# Patient Record
Sex: Female | Born: 1970
Health system: Southern US, Community
[De-identification: ages and names within clinical notes are randomized; demographics above are authoritative.]

## PROBLEM LIST (undated history)

## (undated) DIAGNOSIS — T7840XA Allergy, unspecified, initial encounter: Secondary | ICD-10-CM

## (undated) HISTORY — PX: OTHER SURGICAL HISTORY: SHX169

## (undated) HISTORY — DX: Allergy, unspecified, initial encounter: T78.40XA

---

## 2006-05-30 ENCOUNTER — Other Ambulatory Visit: Admission: RE | Admit: 2006-05-30 | Discharge: 2006-05-30 | Payer: Self-pay | Admitting: Family Medicine

## 2007-11-22 ENCOUNTER — Other Ambulatory Visit: Admission: RE | Admit: 2007-11-22 | Discharge: 2007-11-22 | Payer: Self-pay | Admitting: Family Medicine

## 2008-11-25 ENCOUNTER — Other Ambulatory Visit: Admission: RE | Admit: 2008-11-25 | Discharge: 2008-11-25 | Payer: Self-pay | Admitting: Family Medicine

## 2010-03-02 ENCOUNTER — Encounter: Admission: RE | Admit: 2010-03-02 | Discharge: 2010-03-02 | Payer: Self-pay | Admitting: Family Medicine

## 2010-03-09 ENCOUNTER — Ambulatory Visit: Payer: Self-pay | Admitting: Hematology and Oncology

## 2010-03-24 LAB — COMPREHENSIVE METABOLIC PANEL
ALT: 23 U/L (ref 0–35)
BUN: 10 mg/dL (ref 6–23)
CO2: 21 mEq/L (ref 19–32)
Calcium: 9.1 mg/dL (ref 8.4–10.5)
Chloride: 103 mEq/L (ref 96–112)
Creatinine, Ser: 0.74 mg/dL (ref 0.40–1.20)
Glucose, Bld: 94 mg/dL (ref 70–99)
Potassium: 3.9 mEq/L (ref 3.5–5.3)
Total Bilirubin: 0.6 mg/dL (ref 0.3–1.2)

## 2010-03-24 LAB — CBC WITH DIFFERENTIAL/PLATELET
Basophils Absolute: 0 10*3/uL (ref 0.0–0.1)
EOS%: 1.1 % (ref 0.0–7.0)
Eosinophils Absolute: 0.1 10*3/uL (ref 0.0–0.5)
HCT: 38 % (ref 34.8–46.6)
MCHC: 34.6 g/dL (ref 31.5–36.0)
MONO#: 0.4 10*3/uL (ref 0.1–0.9)
NEUT%: 75.9 % (ref 38.4–76.8)
WBC: 8 10*3/uL (ref 3.9–10.3)
lymph#: 1.4 10*3/uL (ref 0.9–3.3)

## 2010-03-24 LAB — LACTATE DEHYDROGENASE: LDH: 105 U/L (ref 94–250)

## 2011-01-17 ENCOUNTER — Encounter: Payer: Self-pay | Admitting: Family Medicine

## 2011-01-17 ENCOUNTER — Encounter: Payer: Self-pay | Admitting: Hematology and Oncology

## 2012-07-17 ENCOUNTER — Encounter: Payer: Self-pay | Admitting: Physician Assistant

## 2012-07-17 ENCOUNTER — Ambulatory Visit (INDEPENDENT_AMBULATORY_CARE_PROVIDER_SITE_OTHER): Payer: BC Managed Care – PPO | Admitting: Physician Assistant

## 2012-07-17 VITALS — BP 121/72 | HR 81 | Ht 65.0 in | Wt 195.0 lb

## 2012-07-17 DIAGNOSIS — N926 Irregular menstruation, unspecified: Secondary | ICD-10-CM

## 2012-07-17 DIAGNOSIS — Z13220 Encounter for screening for lipoid disorders: Secondary | ICD-10-CM

## 2012-07-17 DIAGNOSIS — Z131 Encounter for screening for diabetes mellitus: Secondary | ICD-10-CM

## 2012-07-17 DIAGNOSIS — Z0000 Encounter for general adult medical examination without abnormal findings: Secondary | ICD-10-CM

## 2012-07-17 DIAGNOSIS — Z1239 Encounter for other screening for malignant neoplasm of breast: Secondary | ICD-10-CM

## 2012-07-17 NOTE — Patient Instructions (Addendum)
Will call with mammogram. Will get labs and call with results. Continue to exercise regularly and maintain a healthy diet. Start prenatal vitamins today since wanting to become pregnant. Recommend 4 servings of dairy daily.

## 2012-07-18 ENCOUNTER — Encounter: Payer: Self-pay | Admitting: Physician Assistant

## 2012-07-18 LAB — HCG, SERUM, QUALITATIVE: Preg, Serum: NEGATIVE

## 2012-07-18 NOTE — Progress Notes (Signed)
  Subjective:     Mariah Moore is a 41 y.o. female and is here for a comprehensive physical exam. The patient reports that she has missed this months period. They are trying to get pregnant. Home pregnancy test negative. She has started exercising regularly and decreasing calories. She feels great.  History   Social History  . Marital Status: Single    Spouse Name: N/A    Number of Children: N/A  . Years of Education: N/A   Occupational History  . Not on file.   Social History Main Topics  . Smoking status: Never Smoker   . Smokeless tobacco: Not on file  . Alcohol Use: Not on file  . Drug Use: Not on file  . Sexually Active: Not on file   Other Topics Concern  . Not on file   Social History Narrative  . No narrative on file   No health maintenance topics applied.  The following portions of the patient's history were reviewed and updated as appropriate: allergies, current medications, past family history, past medical history, past social history, past surgical history and problem list.  Review of Systems A comprehensive review of systems was negative.   Objective:    BP 121/72  Pulse 81  Ht 5\' 5"  (1.651 m)  Wt 195 lb (88.451 kg)  BMI 32.45 kg/m2  SpO2 100%  LMP 05/31/2012 General appearance: alert, cooperative, appears stated age and mildly obese Head: Normocephalic, without obvious abnormality, atraumatic Eyes: conjunctivae/corneas clear. PERRL, EOM's intact. Fundi benign. Ears: normal TM's and external ear canals both ears Nose: Nares normal. Septum midline. Mucosa normal. No drainage or sinus tenderness. Throat: lips, mucosa, and tongue normal; teeth and gums normal Neck: no adenopathy, no carotid bruit, no JVD, supple, symmetrical, trachea midline and thyroid not enlarged, symmetric, no tenderness/mass/nodules Back: symmetric, no curvature. ROM normal. No CVA tenderness. Lungs: clear to auscultation bilaterally Breasts: normal appearance, no masses or  tenderness Heart: regular rate and rhythm, S1, S2 normal, no murmur, click, rub or gallop Abdomen: soft, non-tender; bowel sounds normal; no masses,  no organomegaly Extremities: extremities normal, atraumatic, no cyanosis or edema Pulses: 2+ and symmetric Skin: Skin color, texture, turgor normal. No rashes or lesions Lymph nodes: Cervical, supraclavicular, and axillary nodes normal. Neurologic: Grossly normal    Assessment:    Healthy female exam.      Plan:    CPE- Last Pap 2011. Pap in 1 more years. Vaccines up to date. Start prenatal vitamins since trying to become pregnant. Will do pregnancy test and call with results. Mild exercise to stay healthy even if pregnant is  always helpful. Will get fasting labs and call patient with results. Make sure you continue to have a balanced diet. Will refer for mammogram. Due to family hx of breast cancer.  See After Visit Summary for Counseling Recommendations

## 2012-07-20 LAB — LIPID PANEL
Total CHOL/HDL Ratio: 3.3 Ratio
Triglycerides: 126 mg/dL (ref <150)
VLDL: 25 mg/dL (ref 0–40)

## 2012-07-20 LAB — COMPREHENSIVE METABOLIC PANEL
ALT: 15 U/L (ref 0–35)
AST: 14 U/L (ref 0–37)
Albumin: 4.2 g/dL (ref 3.5–5.2)
Calcium: 9.3 mg/dL (ref 8.4–10.5)
Creat: 0.76 mg/dL (ref 0.50–1.10)
Total Bilirubin: 0.9 mg/dL (ref 0.3–1.2)
Total Protein: 6.6 g/dL (ref 6.0–8.3)

## 2012-07-27 ENCOUNTER — Ambulatory Visit (INDEPENDENT_AMBULATORY_CARE_PROVIDER_SITE_OTHER): Payer: BC Managed Care – PPO | Admitting: Family Medicine

## 2012-07-27 DIAGNOSIS — R739 Hyperglycemia, unspecified: Secondary | ICD-10-CM

## 2012-07-27 DIAGNOSIS — R7309 Other abnormal glucose: Secondary | ICD-10-CM

## 2012-07-27 NOTE — Progress Notes (Signed)
  Subjective:    Patient ID: Mariah Moore, female    DOB: 08-Jan-1971, 41 y.o.   MRN: [id number]  HPI A1c check. Sugar elevated on last labs and was instructed to come by for A1c check.   Review of Systems     Objective:   Physical Exam        Assessment & Plan:  Elevated glucose - A1C is normal. No diabetes.  Pt informed.  Nani Gasser, MD

## 2012-08-01 ENCOUNTER — Ambulatory Visit (INDEPENDENT_AMBULATORY_CARE_PROVIDER_SITE_OTHER): Payer: BC Managed Care – PPO

## 2012-08-01 DIAGNOSIS — Z1231 Encounter for screening mammogram for malignant neoplasm of breast: Secondary | ICD-10-CM

## 2012-08-01 DIAGNOSIS — Z1239 Encounter for other screening for malignant neoplasm of breast: Secondary | ICD-10-CM

## 2012-10-19 ENCOUNTER — Encounter: Payer: Self-pay | Admitting: Family Medicine

## 2012-10-19 ENCOUNTER — Ambulatory Visit (INDEPENDENT_AMBULATORY_CARE_PROVIDER_SITE_OTHER): Payer: BC Managed Care – PPO | Admitting: Family Medicine

## 2012-10-19 VITALS — BP 133/85 | HR 100 | Temp 98.3°F | Ht 65.0 in | Wt 197.0 lb

## 2012-10-19 DIAGNOSIS — J329 Chronic sinusitis, unspecified: Secondary | ICD-10-CM

## 2012-10-19 DIAGNOSIS — J029 Acute pharyngitis, unspecified: Secondary | ICD-10-CM

## 2012-10-19 DIAGNOSIS — H6590 Unspecified nonsuppurative otitis media, unspecified ear: Secondary | ICD-10-CM

## 2012-10-19 MED ORDER — FEXOFENADINE-PSEUDOEPHED ER 180-240 MG PO TB24: 1.0000 | ORAL_TABLET | Freq: Every day | ORAL | Status: DC

## 2012-10-19 MED ORDER — AMOXICILLIN-POT CLAVULANATE 875-125 MG PO TABS: 1.0000 | ORAL_TABLET | Freq: Two times a day (BID) | ORAL | Status: DC

## 2012-10-19 MED ORDER — FLUTICASONE PROPIONATE 50 MCG/ACT NA SUSP: 2.0000 | Freq: Every day | NASAL | Status: DC

## 2012-10-19 NOTE — Patient Instructions (Signed)

## 2012-10-19 NOTE — Progress Notes (Signed)
  Subjective:    Patient ID: Mariah Moore, female    DOB: Feb 24, 1971, 41 y.o.   MRN: [id number]  Otalgia  There is pain in both (L>R) ears. This is a new problem. The current episode started in the past 7 days. The problem occurs constantly. The problem has been gradually worsening. The maximum temperature recorded prior to her arrival was 100 - 100.9 F. The fever has been present for 1 to 2 days. The pain is at a severity of 8/10. The pain is moderate. Associated symptoms include coughing, drainage, headaches, hearing loss, rhinorrhea and a sore throat. Pertinent negatives include no abdominal pain, diarrhea, ear discharge, neck pain, rash or vomiting. She has tried acetaminophen (OTC) for the symptoms. The treatment provided no relief. There is no history of a chronic ear infection, hearing loss or a tympanostomy tube.  Sore Throat  This is a new problem. The current episode started in the past 7 days. The problem has been gradually worsening. The pain is worse on the left (center of throat) side. The maximum temperature recorded prior to her arrival was 100 - 100.9 F. The fever has been present for 1 to 2 days. The pain is at a severity of 8/10. Associated symptoms include coughing, ear pain and headaches. Pertinent negatives include no abdominal pain, diarrhea, ear discharge, neck pain or vomiting. Treatments tried: OTC. The treatment provided no relief.      Review of Systems  HENT: Positive for hearing loss, ear pain, sore throat and rhinorrhea. Negative for neck pain and ear discharge.   Respiratory: Positive for cough.   Gastrointestinal: Negative for vomiting, abdominal pain and diarrhea.  Skin: Negative for rash.  Neurological: Positive for headaches.      BP 133/85  Pulse 100  Temp 98.3 F (36.8 C) (Oral)  Ht 5\' 5"  (1.651 m)  Wt 197 lb (89.359 kg)  BMI 32.78 kg/m2  SpO2 100%  LMP 10/06/2012 Objective:   Physical Exam  Nursing note and vitals reviewed. Constitutional: She is  oriented to person, place, and time. She appears well-developed and well-nourished. No distress.  HENT:  Head: Normocephalic and atraumatic.  Right Ear: Tympanic membrane normal.  Left Ear: Tympanic membrane normal.  Nose: Mucosal edema and rhinorrhea present. Left sinus exhibits maxillary sinus tenderness.  Mouth/Throat: No oral lesions. Posterior oropharyngeal erythema present.          + Swelling LEFT external ear canal. Fluid behind both ears w/L ear>R   Eyes: Conjunctivae normal are normal.  Neck: Neck supple. No thyromegaly present.  Lymphadenopathy:    She has cervical adenopathy.  Neurological: She is alert and oriented to person, place, and time.  Skin: Skin is warm and dry.      Results for orders placed in visit on 10/19/12  POCT RAPID STREP A (OFFICE)      Component Value Range   Rapid Strep A Screen Negative  Negative   Assessment & Plan:   Sinusitis,L serous otitis, pharyngitis. Augmentin, Allegra-D, flonase nasal spray

## 2013-02-20 ENCOUNTER — Encounter: Payer: BC Managed Care – PPO | Admitting: Obstetrics & Gynecology

## 2013-09-03 ENCOUNTER — Other Ambulatory Visit: Payer: Self-pay | Admitting: Physician Assistant

## 2013-09-03 DIAGNOSIS — Z1231 Encounter for screening mammogram for malignant neoplasm of breast: Secondary | ICD-10-CM

## 2013-09-11 ENCOUNTER — Ambulatory Visit (INDEPENDENT_AMBULATORY_CARE_PROVIDER_SITE_OTHER): Payer: BC Managed Care – PPO

## 2013-09-11 DIAGNOSIS — Z1231 Encounter for screening mammogram for malignant neoplasm of breast: Secondary | ICD-10-CM

## 2013-09-26 ENCOUNTER — Encounter: Payer: Self-pay | Admitting: Physician Assistant

## 2013-09-26 ENCOUNTER — Ambulatory Visit (INDEPENDENT_AMBULATORY_CARE_PROVIDER_SITE_OTHER): Payer: BC Managed Care – PPO | Admitting: Physician Assistant

## 2013-09-26 VITALS — BP 118/79 | HR 85 | Wt 199.0 lb

## 2013-09-26 DIAGNOSIS — R51 Headache: Secondary | ICD-10-CM

## 2013-09-26 DIAGNOSIS — R030 Elevated blood-pressure reading, without diagnosis of hypertension: Secondary | ICD-10-CM

## 2013-09-26 DIAGNOSIS — Z23 Encounter for immunization: Secondary | ICD-10-CM

## 2013-09-26 DIAGNOSIS — J019 Acute sinusitis, unspecified: Secondary | ICD-10-CM

## 2013-09-26 MED ORDER — AMOXICILLIN-POT CLAVULANATE 875-125 MG PO TABS: 1.0000 | ORAL_TABLET | Freq: Two times a day (BID) | ORAL | Status: DC

## 2013-09-26 NOTE — Patient Instructions (Signed)

## 2013-09-26 NOTE — Progress Notes (Signed)
  Subjective:    Patient ID: Mariah Moore, female    DOB: 1971-09-27, 42 y.o.   MRN: [id number]  HPI Patient is a 42 year old female who presents to the clinic wanting to followup own blood pressure readings.  Patient has been taking blood pressure at home and readings have more frequently been in the 140 over upper 80s range. She has also complained of headache for 3 weeks every day and she wonders if headache and blood pressure to be correlated. She denies any chest pains, palpitations or vision changes. Her head aches in the frontal bilateral region of head. She describes the pain as dull. She also complains of some facial pain more on the right than the left. She takes Flonase and Zyrtec daily. She has been taking Tylenol for headache and does help some. She feels like her stress level is also a trigger for headaches. She denies any fever, chills, nausea, vomiting or sore throat. She does report some ear congestion bilaterally.    Review of Systems      Objective:   Physical Exam  Constitutional: She is oriented to person, place, and time. She appears well-developed and well-nourished.  HENT:  Head: Normocephalic and atraumatic.  Right Ear: External ear normal.  Left Ear: External ear normal.  TMs are strained with fullness behind membrane obstructing view of the ossicles.  Bilateral maxillary tenderness to palpation.  Bilateral turbinates red and swollen.  Oropharynx erythematous with postnasal drip present.  Eyes: Conjunctivae are normal.  Neck: Normal range of motion. Neck supple.  Cardiovascular: Normal rate, regular rhythm and normal heart sounds.   Pulmonary/Chest: Effort normal and breath sounds normal. She has no wheezes.  Lymphadenopathy:    She has no cervical adenopathy.  Neurological: She is alert and oriented to person, place, and time.  Skin: Skin is warm and dry.  Psychiatric: She has a normal mood and affect. Her behavior is normal.          Assessment &  Plan:  Elevated blood pressure reading-reassured patient after today's blood pressure reading that I think she is taking her blood pressures either at the wrong time or there might be something wrong with the cuff. I encouraged patient to keep a good watch out home and she is more than welcome to come have [place] check her blood pressure is concerned.  Sinusitis/headache-I believe the headache could be coming from sinusitis. Patient did have a lot of fluid behind her ears as well as facial pain. Treated with Augmentin for 10 days. Continue using nasal spray and Zyrtec. Continue to take ibuprofen or Tylenol for headache. Call if not improving or slowly worsening.   Flu shot given today without complication.

## 2014-02-22 ENCOUNTER — Encounter: Payer: Self-pay | Admitting: Physician Assistant

## 2014-02-22 ENCOUNTER — Ambulatory Visit (INDEPENDENT_AMBULATORY_CARE_PROVIDER_SITE_OTHER): Payer: BC Managed Care – PPO | Admitting: Physician Assistant

## 2014-02-22 VITALS — BP 137/79 | HR 106 | Wt 206.0 lb

## 2014-02-22 DIAGNOSIS — R300 Dysuria: Secondary | ICD-10-CM

## 2014-02-22 DIAGNOSIS — O26899 Other specified pregnancy related conditions, unspecified trimester: Secondary | ICD-10-CM

## 2014-02-22 DIAGNOSIS — R319 Hematuria, unspecified: Secondary | ICD-10-CM

## 2014-02-22 DIAGNOSIS — N898 Other specified noninflammatory disorders of vagina: Secondary | ICD-10-CM

## 2014-02-22 LAB — POCT URINALYSIS DIPSTICK
Bilirubin, UA: NEGATIVE
GLUCOSE UA: NEGATIVE
Ketones, UA: NEGATIVE
Leukocytes, UA: NEGATIVE
Nitrite, UA: NEGATIVE
Protein, UA: NEGATIVE
Spec Grav, UA: <=1.005
UROBILINOGEN UA: 0.2
pH, UA: 6

## 2014-02-22 LAB — WET PREP FOR TRICH, YEAST, CLUE
CLUE CELLS WET PREP: NONE SEEN
Trich, Wet Prep: NONE SEEN
WBC WET PREP: NONE SEEN
YEAST WET PREP: NONE SEEN

## 2014-02-22 NOTE — Progress Notes (Signed)
   Subjective:    Patient ID: Mariah Moore, female    DOB: 05/31/1971, 43 y.o.   MRN: [id number]  HPI Pt is a 43 yo female who presents to the clinic with dysuria for one week. She denies any fever, chills, abdominal pain, nausea, vomiting, increase in frequency/urgency of urine. She has noticed a brownish discharge. She is 7 and 1/[redacted] weeks pregnant. She has some itching but off and on. Dysuria is not with every urination. She has not tried anything to make better and nothing seems to make worse.    Review of Systems     Objective:   Physical Exam  Constitutional: She is oriented to person, place, and time. She appears well-developed and well-nourished.  HENT:  Head: Normocephalic and atraumatic.  Cardiovascular: Regular rhythm and normal heart sounds.   Tachycardia at 106.   Pulmonary/Chest: Effort normal and breath sounds normal.  No CVA tenderness.  Abdominal: Soft. Bowel sounds are normal. She exhibits no distension and no mass. There is no tenderness. There is no rebound and no guarding.  Neurological: She is alert and oriented to person, place, and time.  Skin: Skin is dry.  Psychiatric: She has a normal mood and affect. Her behavior is normal.          Assessment & Plan:  Dysuria- UA positive for trace blood, negative for leuks, nitrates, protein. Will culture. Gave HO on dysuria. Since pregnant and symptoms mild I do not want to treat until confirm with culture. Call if symptoms worsening. Stay hydrated.   Vaginal discharge- will get stat wet prep. Could be increase in vaginal secretions since pregnant. Will continue to monitor.

## 2014-02-22 NOTE — Patient Instructions (Signed)

## 2014-02-23 ENCOUNTER — Encounter: Payer: Self-pay | Admitting: Physician Assistant

## 2014-02-24 ENCOUNTER — Encounter: Payer: Self-pay | Admitting: Physician Assistant

## 2014-02-24 LAB — URINE CULTURE: Colony Count: 50000

## 2014-03-08 ENCOUNTER — Ambulatory Visit: Payer: BC Managed Care – PPO | Admitting: Physician Assistant

## 2014-05-29 ENCOUNTER — Other Ambulatory Visit (HOSPITAL_COMMUNITY)
Admission: RE | Admit: 2014-05-29 | Discharge: 2014-05-29 | Disposition: A | Discharge status: Home / Self Care | Payer: BC Managed Care – PPO | Source: Ambulatory Visit | Attending: Family Medicine | Admitting: Family Medicine

## 2014-05-29 ENCOUNTER — Ambulatory Visit (INDEPENDENT_AMBULATORY_CARE_PROVIDER_SITE_OTHER): Payer: BC Managed Care – PPO | Admitting: Physician Assistant

## 2014-05-29 ENCOUNTER — Encounter: Payer: Self-pay | Admitting: Physician Assistant

## 2014-05-29 VITALS — BP 117/66 | HR 85 | Ht 65.0 in | Wt 196.0 lb

## 2014-05-29 DIAGNOSIS — Z01419 Encounter for gynecological examination (general) (routine) without abnormal findings: Secondary | ICD-10-CM | POA: Insufficient documentation

## 2014-05-29 DIAGNOSIS — Z0000 Encounter for general adult medical examination without abnormal findings: Secondary | ICD-10-CM

## 2014-05-29 DIAGNOSIS — Z8759 Personal history of other complications of pregnancy, childbirth and the puerperium: Secondary | ICD-10-CM | POA: Insufficient documentation

## 2014-05-29 DIAGNOSIS — Z1151 Encounter for screening for human papillomavirus (HPV): Secondary | ICD-10-CM | POA: Insufficient documentation

## 2014-05-29 DIAGNOSIS — Z6832 Body mass index (BMI) 32.0-32.9, adult: Secondary | ICD-10-CM | POA: Insufficient documentation

## 2014-05-29 DIAGNOSIS — E669 Obesity, unspecified: Secondary | ICD-10-CM | POA: Insufficient documentation

## 2014-05-29 DIAGNOSIS — Z131 Encounter for screening for diabetes mellitus: Secondary | ICD-10-CM

## 2014-05-29 LAB — COMPLETE METABOLIC PANEL WITH GFR
ALBUMIN: 4.3 g/dL (ref 3.5–5.2)
ALK PHOS: 76 U/L (ref 39–117)
ALT: 19 U/L (ref 0–35)
AST: 17 U/L (ref 0–37)
BILIRUBIN TOTAL: 0.9 mg/dL (ref 0.2–1.2)
BUN: 11 mg/dL (ref 6–23)
CO2: 26 meq/L (ref 19–32)
Calcium: 9.1 mg/dL (ref 8.4–10.5)
Chloride: 101 mEq/L (ref 96–112)
Creat: 0.66 mg/dL (ref 0.50–1.10)
GFR, Est Non African American: >89 mL/min
Glucose, Bld: 105 mg/dL — ABNORMAL HIGH (ref 70–99)
Potassium: 4.1 mEq/L (ref 3.5–5.3)
Sodium: 137 mEq/L (ref 135–145)
Total Protein: 6.7 g/dL (ref 6.0–8.3)

## 2014-05-29 LAB — TSH: TSH: 1.704 u[IU]/mL (ref 0.350–4.500)

## 2014-05-29 NOTE — Progress Notes (Signed)
  Subjective:     Mariah Moore is a 43 y.o. female and is here for a comprehensive physical exam. The patient reports no problems.  Patient does report that she had a miscarriage at 8 weeks after last visit. She is going to start trying to have another baby this month. She is working with [facility] for prenatal care.  History   Social History  . Marital Status: Married    Spouse Name: N/A    Number of Children: N/A  . Years of Education: N/A   Occupational History  . Not on file.   Social History Main Topics  . Smoking status: Never Smoker   . Smokeless tobacco: Not on file  . Alcohol Use: Not on file  . Drug Use: Not on file  . Sexual Activity: Not on file   Other Topics Concern  . Not on file   Social History Narrative  . No narrative on file   Health Maintenance  Topic Date Due  . Influenza Vaccine  07/27/2014  . Mammogram  09/11/2014  . Pap Smear  05/29/2017  . Tetanus/tdap  10/27/2020    The following portions of the patient's history were reviewed and updated as appropriate: allergies, current medications, past family history, past medical history, past social history, past surgical history and problem list.  Review of Systems A comprehensive review of systems was negative.   Objective:    BP 117/66  Pulse 85  Ht 5\' 5"  (1.651 m)  Wt 196 lb (88.905 kg)  BMI 32.62 kg/m2  LMP 05/15/2014  Breastfeeding? Unknown General appearance: alert, cooperative, appears stated age, mildly obese and moderately obese Head: Normocephalic, without obvious abnormality, atraumatic Eyes: conjunctivae/corneas clear. PERRL, EOM's intact. Fundi benign. Ears: normal TM's and external ear canals both ears Nose: Nares normal. Septum midline. Mucosa normal. No drainage or sinus tenderness. Throat: lips, mucosa, and tongue normal; teeth and gums normal Neck: no adenopathy, no carotid bruit, no JVD, supple, symmetrical, trachea midline and thyroid not enlarged, symmetric, no  tenderness/mass/nodules Back: symmetric, no curvature. ROM normal. No CVA tenderness. Lungs: clear to auscultation bilaterally Heart: regular rate and rhythm, S1, S2 normal, no murmur, click, rub or gallop Abdomen: soft, non-tender; bowel sounds normal; no masses,  no organomegaly Pelvic: cervix normal in appearance, external genitalia normal, no adnexal masses or tenderness, no cervical motion tenderness, uterus normal size, shape, and consistency and vagina normal without discharge Extremities: extremities normal, atraumatic, no cyanosis or edema Pulses: 2+ and symmetric Skin: Skin color, texture, turgor normal. No rashes or lesions Lymph nodes: Cervical, supraclavicular, and axillary nodes normal. Neurologic: Grossly normal    Assessment:    Healthy female exam.      Plan:     CPE-Pap done today will call with results. Patient's cholesterol was placed in and 2013. We'll wait at least another year to recheck. Order fasting CMP and thyroid today. Discussed continued to work on weight loss with exercise 150 minutes weekly as well as maintaining a healthy balanced diet. Urged patient to stay on prenatal vitamins since wanting to become pregnant. Patient's last mammogram was done in 9/2 is a 16 due to family history of cancer. Will wait until we see her to repeat that she's pregnant will wait after pregnancy.  See After Visit Summary for Counseling Recommendations

## 2014-05-29 NOTE — Patient Instructions (Signed)

## 2014-05-30 LAB — CYTOLOGY - PAP

## 2014-05-31 ENCOUNTER — Encounter: Payer: Self-pay | Admitting: Physician Assistant

## 2014-06-04 ENCOUNTER — Encounter: Payer: Self-pay | Admitting: Family Medicine

## 2014-06-04 ENCOUNTER — Ambulatory Visit (INDEPENDENT_AMBULATORY_CARE_PROVIDER_SITE_OTHER): Payer: BC Managed Care – PPO | Admitting: Family Medicine

## 2014-06-04 ENCOUNTER — Ambulatory Visit (INDEPENDENT_AMBULATORY_CARE_PROVIDER_SITE_OTHER): Payer: BC Managed Care – PPO

## 2014-06-04 ENCOUNTER — Telehealth: Payer: Self-pay | Admitting: *Deleted

## 2014-06-04 VITALS — BP 127/78 | HR 88 | Temp 98.1°F | Wt 196.0 lb

## 2014-06-04 DIAGNOSIS — R1031 Right lower quadrant pain: Secondary | ICD-10-CM

## 2014-06-04 DIAGNOSIS — R109 Unspecified abdominal pain: Secondary | ICD-10-CM

## 2014-06-04 DIAGNOSIS — E7889 Other lipoprotein metabolism disorders: Secondary | ICD-10-CM

## 2014-06-04 LAB — POCT URINE PREGNANCY: Preg Test, Ur: NEGATIVE

## 2014-06-04 LAB — POCT URINALYSIS DIPSTICK
Bilirubin, UA: NEGATIVE
Glucose, UA: NEGATIVE
KETONES UA: NEGATIVE
LEUKOCYTES UA: NEGATIVE
Nitrite, UA: NEGATIVE
PH UA: 6
RBC UA: NEGATIVE
Urobilinogen, UA: 0.2

## 2014-06-04 MED ORDER — IOHEXOL 300 MG/ML  SOLN
100.0000 mL | Freq: Once | INTRAMUSCULAR | Status: AC | PRN
  Administered 2014-06-04: 100 mL via INTRAVENOUS

## 2014-06-04 MED ORDER — DICYCLOMINE HCL 10 MG PO CAPS: 10.0000 mg | ORAL_CAPSULE | Freq: Four times a day (QID) | ORAL | Status: DC | PRN

## 2014-06-04 NOTE — Telephone Encounter (Signed)
PA obtained for MRI ABD/Pelvis w/contrast. Auth # [id number]. Exp. 07/03/14.  Meyer Cory, LPN

## 2014-06-04 NOTE — Progress Notes (Signed)
CC: Mariah Moore is a 43 y.o. female is here for RUQ pain   Subjective: HPI:   Right lower quadrant pain described as stabbing sensation that was 10 out of 10 yesterday occurring for minutes every 5 minutes. Nothing particularly makes better, it is worse with pressing on the abdomen in the right lower quadrant. It is nonradiating.  Interventions included taking leftover pain medication, she is uncertain exactly what it was. She woke this morning and pain is still lingering but mild in severity and worse with touching the abdomen. She reports chills and sweats this morning and questionable fever yesterday. She denies nausea, vomiting, diarrhea, constipation, vaginal discharge, dysuria, flank pain, nor decreased appetite. She has a history of fibroid surgery but no other abdominal or pelvic surgeries.  Review Of Systems Outlined In HPI  Past Medical History  Diagnosis Date  . Allergy     Past Surgical History  Procedure Laterality Date  . Cesarean section    . Fibriod removal     Family History  Problem Relation Age of Onset  . Cancer Mother     Breast  . Hypertension Mother   . Hyperlipidemia Mother   . Cancer Paternal Grandmother     Breast  . Diabetes Father   . Cancer Father     non hodkins lymphoma  . Stroke Maternal Aunt   . Hypertension Maternal Aunt   . Heart attack Maternal Aunt     History   Social History  . Marital Status: Married    Spouse Name: N/A    Number of Children: N/A  . Years of Education: N/A   Occupational History  . Not on file.   Social History Main Topics  . Smoking status: Never Smoker   . Smokeless tobacco: Not on file  . Alcohol Use: Not on file  . Drug Use: Not on file  . Sexual Activity: Not on file   Other Topics Concern  . Not on file   Social History Narrative  . No narrative on file     Objective: BP 127/78  Pulse 88  Temp(Src) 98.1 F (36.7 C) (Oral)  Wt 196 lb (88.905 kg)  LMP 05/15/2014  General: Alert and  Oriented, No Acute Distress HEENT: Pupils equal, round, reactive to light. Conjunctivae clear.  Moist mucous members are unremarkable Lungs: Clear to auscultation bilaterally, no wheezing/ronchi/rales.  Comfortable work of breathing. Good air movement. Cardiac: Regular rate and rhythm. Normal S1/S2.  No murmurs, rubs, nor gallops.   Abdomen:  Obese, hypoactive bowel sounds,  No palpable masses, she has rebound tenderness in the right lower quadrant and suprapubic region. Psoas sign positive on the right. Extremities: No peripheral edema.  Strong peripheral pulses.  Mental Status: No depression, anxiety, nor agitation. Skin: Warm and dry.  Assessment & Plan: Mariah Moore was seen today for ruq pain.  Diagnoses and associated orders for this visit:  RLQ abdominal pain - Urinalysis Dipstick - CT Abdomen Pelvis W Contrast; Future - POCT urine pregnancy - dicyclomine (BENTYL) 10 MG capsule; Take 1-2 capsules (10-20 mg total) by mouth 4 (four) times daily as needed (abdominal pain.).  Abdominal pain, unspecified site - Urinalysis Dipstick - CT Abdomen Pelvis W Contrast; Future     history and exam is concerning for acute appendicitis versus diverticulitis therefore obtain CT scan, urinalysis unremarkable and urine pregnancy test is negative.  Fortunately her CT scan was negative for any acute abdominal or pelvic process, we'll treat large bowel spasm with as needed dicyclomine.Signs  and symptoms requring emergent/urgent reevaluation were discussed with the patient.  Return if symptoms worsen or fail to improve.

## 2014-07-09 ENCOUNTER — Encounter: Payer: Self-pay | Admitting: Physician Assistant

## 2014-08-30 ENCOUNTER — Ambulatory Visit (INDEPENDENT_AMBULATORY_CARE_PROVIDER_SITE_OTHER): Payer: BC Managed Care – PPO | Admitting: Family Medicine

## 2014-08-30 ENCOUNTER — Encounter: Payer: Self-pay | Admitting: Family Medicine

## 2014-08-30 VITALS — BP 119/78 | HR 83 | Temp 98.0°F | Wt 190.0 lb

## 2014-08-30 DIAGNOSIS — J0100 Acute maxillary sinusitis, unspecified: Secondary | ICD-10-CM | POA: Diagnosis not present

## 2014-08-30 MED ORDER — AMOXICILLIN-POT CLAVULANATE 875-125 MG PO TABS: 1.0000 | ORAL_TABLET | Freq: Two times a day (BID) | ORAL | Status: DC

## 2014-08-30 NOTE — Progress Notes (Signed)
   Subjective:    Patient ID: Mariah Moore, female    DOB: 1971-05-05, 43 y.o.   MRN: [id number]  Sinusitis   Sinus sxs x 5 days she has been using sudafed, and tylenol. she has been experiencing headaches and pressure sore around her nose and yellow mucus. She feels like she is getting worse.  Has had some low grade temp. No ST.  Sore on her cheekc and bilat ear aching.  No recent antibiotic.     Review of Systems     Objective:   Physical Exam  Constitutional: She is oriented to person, place, and time. She appears well-developed and well-nourished.  HENT:  Head: Normocephalic and atraumatic.  Right Ear: External ear normal.  Left Ear: External ear normal.  Nose: Nose normal.  Mouth/Throat: Oropharynx is clear and moist.  TMs and canals are clear.   Eyes: Conjunctivae and EOM are normal. Pupils are equal, round, and reactive to light.  Neck: Neck supple. No thyromegaly present.  Cardiovascular: Normal rate, regular rhythm and normal heart sounds.   Pulmonary/Chest: Effort normal and breath sounds normal. She has no wheezes.  Lymphadenopathy:    She has no cervical adenopathy.  Neurological: She is alert and oriented to person, place, and time.  Skin: Skin is warm and dry.  Psychiatric: She has a normal mood and affect.          Assessment & Plan:  Acute sinusitis -  She's a little bit early for treatment but she is leaving town over the weekend. And we will be closed on Monday. I will go ahead and give her prescription. Encouraged her to get a couple more days to see if she starts to improve on her own. If not she can go ahead and fill the prescription. If she's been taking it for a week and not improving then please call the office back. Or she feels she suddenly getting worse. Continue symptomatic care as needed.

## 2014-10-15 ENCOUNTER — Other Ambulatory Visit: Payer: Self-pay | Admitting: Physician Assistant

## 2014-10-15 DIAGNOSIS — Z1231 Encounter for screening mammogram for malignant neoplasm of breast: Secondary | ICD-10-CM

## 2014-10-16 ENCOUNTER — Ambulatory Visit (INDEPENDENT_AMBULATORY_CARE_PROVIDER_SITE_OTHER): Payer: BC Managed Care – PPO

## 2014-10-16 DIAGNOSIS — Z1231 Encounter for screening mammogram for malignant neoplasm of breast: Secondary | ICD-10-CM

## 2014-10-28 ENCOUNTER — Encounter: Payer: Self-pay | Admitting: Family Medicine

## 2015-03-10 ENCOUNTER — Ambulatory Visit (INDEPENDENT_AMBULATORY_CARE_PROVIDER_SITE_OTHER): Payer: BLUE CROSS/BLUE SHIELD | Admitting: Physician Assistant

## 2015-03-10 ENCOUNTER — Encounter: Payer: Self-pay | Admitting: Physician Assistant

## 2015-03-10 VITALS — BP 129/86 | HR 95 | Temp 98.5°F | Wt 204.0 lb

## 2015-03-10 DIAGNOSIS — J0100 Acute maxillary sinusitis, unspecified: Secondary | ICD-10-CM | POA: Diagnosis not present

## 2015-03-10 MED ORDER — FLUTICASONE PROPIONATE 50 MCG/ACT NA SUSP: 2.0000 | Freq: Every day | NASAL | Status: DC

## 2015-03-10 MED ORDER — AMOXICILLIN-POT CLAVULANATE 875-125 MG PO TABS: 1.0000 | ORAL_TABLET | Freq: Two times a day (BID) | ORAL | Status: DC

## 2015-03-10 NOTE — Patient Instructions (Signed)
Mariah Moore med sinus rinse.   Sinusitis Sinusitis is redness, soreness, and inflammation of the paranasal sinuses. Paranasal sinuses are air pockets within the bones of your face (beneath the eyes, the middle of the forehead, or above the eyes). In healthy paranasal sinuses, mucus is able to drain out, and air is able to circulate through them by way of your nose. However, when your paranasal sinuses are inflamed, mucus and air can become trapped. This can allow bacteria and other germs to grow and cause infection. Sinusitis can develop quickly and last only a short time (acute) or continue over a long period (chronic). Sinusitis that lasts for more than 12 weeks is considered chronic.  CAUSES  Causes of sinusitis include:  Allergies.  Structural abnormalities, such as displacement of the cartilage that separates your nostrils (deviated septum), which can decrease the air flow through your nose and sinuses and affect sinus drainage.  Functional abnormalities, such as when the small hairs (cilia) that line your sinuses and help remove mucus do not work properly or are not present. SIGNS AND SYMPTOMS  Symptoms of acute and chronic sinusitis are the same. The primary symptoms are pain and pressure around the affected sinuses. Other symptoms include:  Upper toothache.  Earache.  Headache.  Bad breath.  Decreased sense of smell and taste.  A cough, which worsens when you are lying flat.  Fatigue.  Fever.  Thick drainage from your nose, which often is green and may contain pus (purulent).  Swelling and warmth over the affected sinuses. DIAGNOSIS  Your health care provider will perform a physical exam. During the exam, your health care provider may:  Look in your nose for signs of abnormal growths in your nostrils (nasal polyps).  Tap over the affected sinus to check for signs of infection.  View the inside of your sinuses (endoscopy) using an imaging device that has a light attached  (endoscope). If your health care provider suspects that you have chronic sinusitis, one or more of the following tests may be recommended:  Allergy tests.  Nasal culture. A sample of mucus is taken from your nose, sent to a lab, and screened for bacteria.  Nasal cytology. A sample of mucus is taken from your nose and examined by your health care provider to determine if your sinusitis is related to an allergy. TREATMENT  Most cases of acute sinusitis are related to a viral infection and will resolve on their own within 10 days. Sometimes medicines are prescribed to help relieve symptoms (pain medicine, decongestants, nasal steroid sprays, or saline sprays).  However, for sinusitis related to a bacterial infection, your health care provider will prescribe antibiotic medicines. These are medicines that will help kill the bacteria causing the infection.  Rarely, sinusitis is caused by a fungal infection. In theses cases, your health care provider will prescribe antifungal medicine. For some cases of chronic sinusitis, surgery is needed. Generally, these are cases in which sinusitis recurs more than 3 times per year, despite other treatments. HOME CARE INSTRUCTIONS   Drink plenty of water. Water helps thin the mucus so your sinuses can drain more easily.  Use a humidifier.  Inhale steam 3 to 4 times a day (for example, sit in the bathroom with the shower running).  Apply a warm, moist washcloth to your face 3 to 4 times a day, or as directed by your health care provider.  Use saline nasal sprays to help moisten and clean your sinuses.  Take medicines only as directed  by your health care provider.  If you were prescribed either an antibiotic or antifungal medicine, finish it all even if you start to feel better. SEEK IMMEDIATE MEDICAL CARE IF:  You have increasing pain or severe headaches.  You have nausea, vomiting, or drowsiness.  You have swelling around your face.  You have vision  problems.  You have a stiff neck.  You have difficulty breathing. MAKE SURE YOU:   Understand these instructions.  Will watch your condition.  Will get help right away if you are not doing well or get worse. Document Released: 12/13/2005 Document Revised: 04/29/2014 Document Reviewed: 12/28/2011 [facility] Patient Information 2015 [place], [place]. This information is not intended to replace advice given to you by your health care provider. Make sure you discuss any questions you have with your health care provider.

## 2015-03-10 NOTE — Progress Notes (Signed)
   Subjective:    Patient ID: Mariah Moore, female    DOB: 10/19/1971, 44 y.o.   MRN: [id number]  HPI  Pt presents to the clinic with 10 days of green nasal congestion, headache daily, sinus pressure more on left than right. Taking dayquil and rice pack on sinuses. No fever, chills, cough, SOB. Not improving.      Review of Systems  All other systems reviewed and are negative.      Objective:   Physical Exam  Constitutional: She is oriented to person, place, and time. She appears well-developed and well-nourished.  HENT:  Head: Normocephalic and atraumatic.  Right Ear: External ear normal.  Left Ear: External ear normal.  Mouth/Throat: Oropharynx is clear and moist. No oropharyngeal exudate.  TM's erythematous no blood or pus.   Maxillary left sinus tender to palpation.   Bilateral nasal turbinates red and swollen.   Eyes: Conjunctivae are normal. Right eye exhibits no discharge. Left eye exhibits no discharge.  Neck: Normal range of motion. Neck supple.  Cardiovascular: Normal rate, regular rhythm and normal heart sounds.   Pulmonary/Chest: Effort normal and breath sounds normal. She has no wheezes.  Lymphadenopathy:    She has no cervical adenopathy.  Neurological: She is alert and oriented to person, place, and time.  Skin: Skin is dry.  Psychiatric: She has a normal mood and affect. Her behavior is normal.          Assessment & Plan:  Acute maxillary sinusitis- augmentin for 10 days. flonase as needed. HO given. Encouraged nasal saline rinses. Follow up if not improving.

## 2015-03-17 ENCOUNTER — Encounter: Payer: Self-pay | Admitting: Physician Assistant

## 2015-03-17 ENCOUNTER — Other Ambulatory Visit: Payer: Self-pay | Admitting: Physician Assistant

## 2015-03-17 MED ORDER — METHYLPREDNISOLONE (PAK) 4 MG PO TABS: ORAL_TABLET | ORAL | Status: DC

## 2015-04-17 ENCOUNTER — Encounter: Payer: Self-pay | Admitting: Physician Assistant

## 2015-06-24 ENCOUNTER — Encounter: Payer: Self-pay | Admitting: Family Medicine

## 2015-06-24 ENCOUNTER — Encounter: Payer: Self-pay | Admitting: Physician Assistant

## 2015-06-24 ENCOUNTER — Ambulatory Visit (INDEPENDENT_AMBULATORY_CARE_PROVIDER_SITE_OTHER): Payer: BLUE CROSS/BLUE SHIELD | Admitting: Family Medicine

## 2015-06-24 ENCOUNTER — Telehealth: Payer: Self-pay | Admitting: *Deleted

## 2015-06-24 VITALS — BP 141/89 | HR 86 | Temp 98.1°F | Wt 202.0 lb

## 2015-06-24 DIAGNOSIS — A499 Bacterial infection, unspecified: Secondary | ICD-10-CM

## 2015-06-24 DIAGNOSIS — J329 Chronic sinusitis, unspecified: Secondary | ICD-10-CM | POA: Diagnosis not present

## 2015-06-24 DIAGNOSIS — B9689 Other specified bacterial agents as the cause of diseases classified elsewhere: Secondary | ICD-10-CM

## 2015-06-24 MED ORDER — DOXYCYCLINE HYCLATE 100 MG PO TABS: ORAL_TABLET | ORAL | Status: DC

## 2015-06-24 MED ORDER — CEFDINIR 300 MG PO CAPS: 300.0000 mg | ORAL_CAPSULE | Freq: Two times a day (BID) | ORAL | Status: AC

## 2015-06-24 NOTE — Telephone Encounter (Signed)
Rx of cefdinir sent to [facility] in [place]

## 2015-06-24 NOTE — Progress Notes (Signed)
CC: Mariah Moore is a 44 y.o. female is here for Sinusitis   Subjective: HPI: Nonproductive cough, facial pressure in the forehead, nasal congestion and postnasal drip with some pressure in the ears that has been present for more than 7 days now. Present on a daily basis worse when trying to go to bed at night. No benefit from DayQuil or NyQuil. No other interventions as of yet. Originally accompanied by subjective fever but this has passed. Denies shortness of breath, productive cough, chest discomfort or wheezing. No rash or confusion    Review Of Systems Outlined In HPI  Past Medical History  Diagnosis Date  . Allergy     Past Surgical History  Procedure Laterality Date  . Cesarean section    . Fibriod removal     Family History  Problem Relation Age of Onset  . Cancer Mother     Breast  . Hypertension Mother   . Hyperlipidemia Mother   . Cancer Paternal Grandmother     Breast  . Diabetes Father   . Cancer Father     non hodkins lymphoma  . Stroke Maternal Aunt   . Hypertension Maternal Aunt   . Heart attack Maternal Aunt     History   Social History  . Marital Status: Married    Spouse Name: N/A  . Number of Children: N/A  . Years of Education: N/A   Occupational History  . Not on file.   Social History Main Topics  . Smoking status: Never Smoker   . Smokeless tobacco: Not on file  . Alcohol Use: Not on file  . Drug Use: Not on file  . Sexual Activity: Not on file   Other Topics Concern  . Not on file   Social History Narrative     Objective: BP 141/89 mmHg  Pulse 86  Temp(Src) 98.1 F (36.7 C) (Oral)  Wt 202 lb (91.627 kg)  General: Alert and Oriented, No Acute Distress HEENT: Pupils equal, round, reactive to light. Conjunctivae clear.  External ears unremarkable, canals clear with intact TMs with appropriate landmarks.  Middle ear appears open without effusion. Pink inferior turbinates.  Moist mucous membranes, pharynx without inflammation nor  lesions other than moderate postnasal drip.  Neck supple without palpable lymphadenopathy nor abnormal masses. Lungs: Clear to auscultation bilaterally, no wheezing/ronchi/rales.  Comfortable work of breathing. Good air movement. Extremities: No peripheral edema.  Strong peripheral pulses.  Mental Status: No depression, anxiety, nor agitation. Skin: Warm and dry.  Assessment & Plan: Mariah Moore was seen today for sinusitis.  Diagnoses and all orders for this visit:  Bacterial sinusitis Orders: -     doxycycline (VIBRA-TABS) 100 MG tablet; One by mouth twice a day for ten days.   Bacterial sinusitis: She was on Augmentin 3 months ago therefore will avoid this and start doxycycline. Consider nasal saline washes and Mucinex.  Return if symptoms worsen or fail to improve.

## 2015-06-24 NOTE — Telephone Encounter (Signed)
My chart message: I picked up the Doxycycline Hyclate and I did take the first dose. I started reading the material about side effects and it worried me. I know that every medicine has a lot of possible side effects but the part where it could cause problems such as holes in your esophagus and swelling of your throat bothered me. There are a lot of reviews from people saying that it caused them to have problems with their esophagus. Can you prescribe me another antibiotic? I don't really feel good about taking the current one. Thanks for your help!

## 2015-06-25 ENCOUNTER — Encounter: Payer: Self-pay | Admitting: Family Medicine

## 2015-06-25 NOTE — Telephone Encounter (Signed)
Pt.notified

## 2015-10-28 ENCOUNTER — Other Ambulatory Visit: Payer: Self-pay | Admitting: Physician Assistant

## 2015-10-28 DIAGNOSIS — Z1231 Encounter for screening mammogram for malignant neoplasm of breast: Secondary | ICD-10-CM

## 2015-11-06 ENCOUNTER — Ambulatory Visit: Payer: BLUE CROSS/BLUE SHIELD

## 2016-01-29 ENCOUNTER — Ambulatory Visit (INDEPENDENT_AMBULATORY_CARE_PROVIDER_SITE_OTHER): Payer: BLUE CROSS/BLUE SHIELD

## 2016-01-29 DIAGNOSIS — Z1231 Encounter for screening mammogram for malignant neoplasm of breast: Secondary | ICD-10-CM

## 2016-02-10 ENCOUNTER — Telehealth: Payer: BLUE CROSS/BLUE SHIELD | Admitting: Family

## 2016-02-10 DIAGNOSIS — B9689 Other specified bacterial agents as the cause of diseases classified elsewhere: Secondary | ICD-10-CM

## 2016-02-10 DIAGNOSIS — A499 Bacterial infection, unspecified: Secondary | ICD-10-CM

## 2016-02-10 DIAGNOSIS — J329 Chronic sinusitis, unspecified: Secondary | ICD-10-CM

## 2016-02-10 MED ORDER — AMOXICILLIN-POT CLAVULANATE 875-125 MG PO TABS: 1.0000 | ORAL_TABLET | Freq: Two times a day (BID) | ORAL | Status: DC

## 2016-02-10 NOTE — Progress Notes (Signed)

## 2016-02-13 ENCOUNTER — Encounter: Payer: Self-pay | Admitting: Physician Assistant

## 2016-04-16 ENCOUNTER — Other Ambulatory Visit: Payer: Self-pay | Admitting: Physician Assistant

## 2016-07-28 ENCOUNTER — Ambulatory Visit (INDEPENDENT_AMBULATORY_CARE_PROVIDER_SITE_OTHER): Payer: BLUE CROSS/BLUE SHIELD | Admitting: Physician Assistant

## 2016-07-28 ENCOUNTER — Encounter: Payer: Self-pay | Admitting: Physician Assistant

## 2016-07-28 VITALS — BP 130/67 | HR 82 | Ht 65.0 in | Wt 197.0 lb

## 2016-07-28 DIAGNOSIS — Z131 Encounter for screening for diabetes mellitus: Secondary | ICD-10-CM | POA: Diagnosis not present

## 2016-07-28 DIAGNOSIS — E669 Obesity, unspecified: Secondary | ICD-10-CM

## 2016-07-28 DIAGNOSIS — K219 Gastro-esophageal reflux disease without esophagitis: Secondary | ICD-10-CM

## 2016-07-28 DIAGNOSIS — R1310 Dysphagia, unspecified: Secondary | ICD-10-CM | POA: Insufficient documentation

## 2016-07-28 DIAGNOSIS — E042 Nontoxic multinodular goiter: Secondary | ICD-10-CM

## 2016-07-28 DIAGNOSIS — E010 Iodine-deficiency related diffuse (endemic) goiter: Secondary | ICD-10-CM | POA: Insufficient documentation

## 2016-07-28 DIAGNOSIS — Z13220 Encounter for screening for lipoid disorders: Secondary | ICD-10-CM

## 2016-07-28 DIAGNOSIS — E049 Nontoxic goiter, unspecified: Secondary | ICD-10-CM

## 2016-07-28 DIAGNOSIS — N926 Irregular menstruation, unspecified: Secondary | ICD-10-CM | POA: Diagnosis not present

## 2016-07-28 DIAGNOSIS — Z0000 Encounter for general adult medical examination without abnormal findings: Secondary | ICD-10-CM

## 2016-07-28 LAB — COMPLETE METABOLIC PANEL WITH GFR
ALBUMIN: 4.3 g/dL (ref 3.6–5.1)
ALK PHOS: 87 U/L (ref 33–115)
ALT: 20 U/L (ref 6–29)
AST: 15 U/L (ref 10–30)
BILIRUBIN TOTAL: 1.1 mg/dL (ref 0.2–1.2)
BUN: 16 mg/dL (ref 7–25)
CALCIUM: 9.4 mg/dL (ref 8.6–10.2)
CO2: 26 mmol/L (ref 20–31)
Chloride: 103 mmol/L (ref 98–110)
Creat: 0.85 mg/dL (ref 0.50–1.10)
GFR, Est African American: >89 mL/min (ref >=60)
GFR, Est Non African American: 84 mL/min (ref >=60)
GLUCOSE: 116 mg/dL — AB (ref 65–99)
Potassium: 4 mmol/L (ref 3.5–5.3)
SODIUM: 141 mmol/L (ref 135–146)
TOTAL PROTEIN: 7.1 g/dL (ref 6.1–8.1)

## 2016-07-28 LAB — TSH: TSH: 1.44 m[IU]/L

## 2016-07-28 LAB — LIPID PANEL
CHOL/HDL RATIO: 2.9 ratio (ref <=5.0)
CHOLESTEROL: 176 mg/dL (ref 125–200)
HDL: 61 mg/dL (ref >=46)
LDL Cholesterol: 87 mg/dL (ref <130)
TRIGLYCERIDES: 139 mg/dL (ref <150)
VLDL: 28 mg/dL (ref <30)

## 2016-07-28 LAB — FOLLICLE STIMULATING HORMONE: FSH: 89.4 m[IU]/mL

## 2016-07-28 LAB — HCG, SERUM, QUALITATIVE: Preg, Serum: NEGATIVE

## 2016-07-28 LAB — LUTEINIZING HORMONE: LH: 53.6 m[IU]/mL

## 2016-07-28 MED ORDER — RANITIDINE HCL 150 MG PO TABS: 150.0000 mg | ORAL_TABLET | Freq: Two times a day (BID) | ORAL | 5 refills | Status: DC

## 2016-07-28 NOTE — Patient Instructions (Signed)

## 2016-07-28 NOTE — Progress Notes (Addendum)
Patient ID: Mariah Moore, female   DOB: 1971/05/26, 45 y.o.   MRN: [id number]  Chief Complaint  Patient presents with  . Annual Exam  . Gastroesophageal Reflux  . Obesity    HPI Mariah Moore is a 45 y.o. female that present to clinic for complete physical exam. She complains of ingestion for the last 3 weeks. She feels reflux coming up with most foods even fruits and salads. She states it keeps her from sleeping at night. She reports minimal relief with Tums and Pepsid AC. She is not eating past 8:30 PM and working on losing weight.  She does report some difficulty swallowing occasional. She denies cough.  She is also actively trying to get pregnant. She has missed two periods. She requests a blood pregnancy test and hormone level testing to see if she is in early menopause.    She also complains of achy pain in the bilateral anterior arm that started last week. She hasn't tried anything for it. She states her back muscles are tight. She denies falls or injuries. She has recently moved into a new house.   HPI  Past Medical History:  Diagnosis Date  . Allergy     Past Surgical History:  Procedure Laterality Date  . CESAREAN SECTION    . Fibriod removal      Family History  Problem Relation Age of Onset  . Cancer Mother     Breast  . Hypertension Mother   . Hyperlipidemia Mother   . Cancer Paternal Grandmother     Breast  . Diabetes Father   . Cancer Father     non hodkins lymphoma  . Stroke Maternal Aunt   . Hypertension Maternal Aunt   . Heart attack Maternal Aunt     Social History Social History  Substance Use Topics  . Smoking status: Never Smoker  . Smokeless tobacco: Not on file  . Alcohol use Not on file    Allergies  Allergen Reactions  . Bee Venom Swelling  . Codeine Nausea And Vomiting  . Sulfa Antibiotics Nausea And Vomiting    Current Outpatient Prescriptions  Medication Sig Dispense Refill  . cetirizine (ZYRTEC) 10 MG tablet Take 10 mg by mouth  daily.    . fluticasone (FLONASE) 50 MCG/ACT nasal spray SHAKE WELL AND USE 2 SPRAYS IN EACH NOSTRIL DAILY 16 g 3  . polyethylene glycol powder (MIRALAX) powder Take 1 Container by mouth as needed.    . Prenatal MV-Min-Fe Fum-FA-DHA (PRENATAL 1 PO) Take by mouth.    . ranitidine (ZANTAC) 150 MG tablet Take 1 tablet (150 mg total) by mouth 2 (two) times daily. 60 tablet 5   No current facility-administered medications for this visit.     Review of Systems Review of Systems  All other systems reviewed and are negative.   Blood pressure 130/67, pulse 82, height  (1.651 m), weight 197 lb (89.4 kg), unknown if currently breastfeeding.  Physical Exam Physical Exam  Constitutional: She is oriented to person, place, and time. She appears well-developed and well-nourished.  HENT:  Head: Normocephalic and atraumatic.  Right Ear: Tympanic membrane, external ear and ear canal normal.  Left Ear: Tympanic membrane, external ear and ear canal normal.  Eyes: Conjunctivae and EOM are normal. Pupils are equal, round, and reactive to light.  Neck: Normal range of motion. Thyromegaly present.  Mild asymmetrical thyroid enlargement R>L on palpation.   Cardiovascular: Normal rate, regular rhythm and normal heart sounds.   Pulmonary/Chest: Effort normal  and breath sounds normal.  Abdominal: Soft. Bowel sounds are normal. There is no tenderness. There is no rebound and no guarding.  Musculoskeletal: Normal range of motion.  No point tenderness to the upper arms or shoulder.  Mild tenderness over the paraspinous muscles of the upper back.  Full ROM  Neurological: She is alert and oriented to person, place, and time. She has normal reflexes.  Skin: Skin is warm and dry.  Psychiatric: She has a normal mood and affect. Her behavior is normal.    Assessment    CPE-  Pap smear is up to date.  Vaccines up to date. Screening Lipid panel, and cmp  ordered today Encouraged regular exercise and healthy  diet choices.  Vitamin 800 units and calcium 1500mg  encouraged. Follow up in one year    GERD-  Discussed foods to avoid, remaining up right for two hours after eating, and decreasing weight to aid in symptoms. Start with Zantac 150 mg tablet thirty minutes before the heaviest meal instead of PPI because she is actively trying to get pregnant  Missed period- Ordered serum hCG, LH, FSH, and total estrogen today  Considering her age, recommended going to a fertility specialist in unable to get pregnant within 3-6 months.   Shoulder pain- Likely due to stress of during recent move Suggest therapeutic message, heating pads, and Biofreeze.  Avoid NSAIDs since she is trying to get pregnant. Follow up if symptoms worsen or fail to improve  Thyromegaly/difficultly swallowing- Check TSH and Thyroid antibodies  Ordered Thyroid US

## 2016-07-29 DIAGNOSIS — Z882 Allergy status to sulfonamides status: Secondary | ICD-10-CM | POA: Diagnosis not present

## 2016-07-29 DIAGNOSIS — R0602 Shortness of breath: Secondary | ICD-10-CM | POA: Diagnosis not present

## 2016-07-29 DIAGNOSIS — K219 Gastro-esophageal reflux disease without esophagitis: Secondary | ICD-10-CM | POA: Diagnosis not present

## 2016-07-29 DIAGNOSIS — Z885 Allergy status to narcotic agent status: Secondary | ICD-10-CM | POA: Diagnosis not present

## 2016-07-29 DIAGNOSIS — R1013 Epigastric pain: Secondary | ICD-10-CM | POA: Diagnosis not present

## 2016-07-29 DIAGNOSIS — R0789 Other chest pain: Secondary | ICD-10-CM | POA: Diagnosis not present

## 2016-07-29 DIAGNOSIS — R079 Chest pain, unspecified: Secondary | ICD-10-CM | POA: Diagnosis not present

## 2016-07-29 LAB — THYROID ANTIBODIES
Thyroglobulin Ab: <1 IU/mL (ref <2)
Thyroperoxidase Ab SerPl-aCnc: 8 IU/mL (ref <9)

## 2016-07-30 ENCOUNTER — Encounter: Payer: Self-pay | Admitting: Physician Assistant

## 2016-07-30 ENCOUNTER — Other Ambulatory Visit: Payer: BLUE CROSS/BLUE SHIELD

## 2016-07-30 DIAGNOSIS — E28319 Asymptomatic premature menopause: Secondary | ICD-10-CM | POA: Insufficient documentation

## 2016-08-02 ENCOUNTER — Telehealth: Payer: Self-pay | Admitting: *Deleted

## 2016-08-02 ENCOUNTER — Ambulatory Visit (INDEPENDENT_AMBULATORY_CARE_PROVIDER_SITE_OTHER): Payer: BLUE CROSS/BLUE SHIELD

## 2016-08-02 ENCOUNTER — Encounter: Payer: Self-pay | Admitting: Physician Assistant

## 2016-08-02 DIAGNOSIS — R9431 Abnormal electrocardiogram [ECG] [EKG]: Secondary | ICD-10-CM | POA: Diagnosis not present

## 2016-08-02 DIAGNOSIS — E010 Iodine-deficiency related diffuse (endemic) goiter: Secondary | ICD-10-CM | POA: Diagnosis not present

## 2016-08-02 DIAGNOSIS — R079 Chest pain, unspecified: Secondary | ICD-10-CM | POA: Diagnosis not present

## 2016-08-02 DIAGNOSIS — E042 Nontoxic multinodular goiter: Secondary | ICD-10-CM | POA: Diagnosis not present

## 2016-08-02 DIAGNOSIS — E669 Obesity, unspecified: Secondary | ICD-10-CM

## 2016-08-02 DIAGNOSIS — R7301 Impaired fasting glucose: Secondary | ICD-10-CM

## 2016-08-02 NOTE — Telephone Encounter (Signed)
A1c ordered.

## 2016-08-03 ENCOUNTER — Encounter: Payer: Self-pay | Admitting: Physician Assistant

## 2016-08-03 DIAGNOSIS — E042 Nontoxic multinodular goiter: Secondary | ICD-10-CM | POA: Insufficient documentation

## 2016-08-03 LAB — ESTROGENS, TOTAL: Estrogen: 110.4 pg/mL

## 2016-08-03 NOTE — Addendum Note (Signed)
Addended by: Jomarie LongsBREEBACK, Hatsuko Bizzarro L on: 08/03/2016 12:27 PM   Modules accepted: Orders

## 2016-08-04 ENCOUNTER — Encounter: Payer: Self-pay | Admitting: Physician Assistant

## 2016-08-04 ENCOUNTER — Other Ambulatory Visit: Payer: BLUE CROSS/BLUE SHIELD

## 2016-08-04 DIAGNOSIS — R9431 Abnormal electrocardiogram [ECG] [EKG]: Secondary | ICD-10-CM | POA: Diagnosis not present

## 2016-08-04 DIAGNOSIS — R079 Chest pain, unspecified: Secondary | ICD-10-CM | POA: Diagnosis not present

## 2016-08-05 ENCOUNTER — Other Ambulatory Visit: Payer: Self-pay | Admitting: Physician Assistant

## 2016-08-05 MED ORDER — AMOXICILLIN-POT CLAVULANATE 875-125 MG PO TABS: 1.0000 | ORAL_TABLET | Freq: Two times a day (BID) | ORAL | 0 refills | Status: DC

## 2016-08-06 ENCOUNTER — Telehealth: Payer: Self-pay

## 2016-08-06 ENCOUNTER — Other Ambulatory Visit: Payer: Self-pay | Admitting: Family Medicine

## 2016-08-06 ENCOUNTER — Telehealth: Payer: Self-pay | Admitting: *Deleted

## 2016-08-06 DIAGNOSIS — E042 Nontoxic multinodular goiter: Secondary | ICD-10-CM

## 2016-08-06 DIAGNOSIS — E010 Iodine-deficiency related diffuse (endemic) goiter: Secondary | ICD-10-CM

## 2016-08-06 MED ORDER — AMOXICILLIN-POT CLAVULANATE 600-42.9 MG/5ML PO SUSR: 7.0000 mL | Freq: Two times a day (BID) | ORAL | 0 refills | Status: DC

## 2016-08-06 MED ORDER — AMOXICILLIN-POT CLAVULANATE 250-62.5 MG/5ML PO SUSR: 500.0000 mg | Freq: Three times a day (TID) | ORAL | 0 refills | Status: DC

## 2016-08-06 NOTE — Telephone Encounter (Signed)
Thyroid biopsy ordered

## 2016-08-06 NOTE — Telephone Encounter (Signed)
Patient advised through MyChart.

## 2016-08-06 NOTE — Telephone Encounter (Signed)
Ok, new rx sent as liquid.

## 2016-08-06 NOTE — Telephone Encounter (Signed)
Called pt and informed her that new rx for liquid abx has been sent to pharmacy. Was told by pharmacy staff that rx may not be covered by her insurance since she just p/u pills.Loralee PacasBarkley, Verlin Duke [place]

## 2016-08-06 NOTE — Telephone Encounter (Signed)
[name] called and states the Augmentin tablet is to big to swallow. She tried to cut the tablet in half but it just broke apart. She would like a liquid form or a different antibiotic sent in. Please advise.

## 2016-08-06 NOTE — Progress Notes (Signed)
Pharmacy did not have the 250 mg dose liquid. So we'll switch to Augmentin ES 600. New Rx sent.

## 2016-08-10 ENCOUNTER — Encounter: Payer: Self-pay | Admitting: Physician Assistant

## 2016-08-11 ENCOUNTER — Encounter: Payer: Self-pay | Admitting: Physician Assistant

## 2016-08-11 ENCOUNTER — Telehealth: Payer: Self-pay | Admitting: *Deleted

## 2016-08-11 DIAGNOSIS — E010 Iodine-deficiency related diffuse (endemic) goiter: Secondary | ICD-10-CM

## 2016-08-11 DIAGNOSIS — E041 Nontoxic single thyroid nodule: Secondary | ICD-10-CM

## 2016-08-11 NOTE — Telephone Encounter (Signed)
Another thyroid biopsy ordered per [facility].

## 2016-08-16 ENCOUNTER — Encounter: Payer: Self-pay | Admitting: Physician Assistant

## 2016-08-16 DIAGNOSIS — R079 Chest pain, unspecified: Secondary | ICD-10-CM | POA: Insufficient documentation

## 2016-09-01 ENCOUNTER — Encounter: Payer: Self-pay | Admitting: Physician Assistant

## 2016-09-01 DIAGNOSIS — E041 Nontoxic single thyroid nodule: Secondary | ICD-10-CM | POA: Diagnosis not present

## 2016-09-01 DIAGNOSIS — E042 Nontoxic multinodular goiter: Secondary | ICD-10-CM | POA: Diagnosis not present

## 2016-09-03 ENCOUNTER — Encounter: Payer: Self-pay | Admitting: Physician Assistant

## 2016-09-29 ENCOUNTER — Encounter: Payer: Self-pay | Admitting: Physician Assistant

## 2016-10-12 ENCOUNTER — Telehealth: Payer: BLUE CROSS/BLUE SHIELD | Admitting: Family

## 2016-10-12 DIAGNOSIS — B9689 Other specified bacterial agents as the cause of diseases classified elsewhere: Secondary | ICD-10-CM

## 2016-10-12 DIAGNOSIS — J019 Acute sinusitis, unspecified: Secondary | ICD-10-CM

## 2016-10-12 MED ORDER — DOXYCYCLINE HYCLATE 100 MG PO TABS
100.0000 mg | ORAL_TABLET | Freq: Two times a day (BID) | ORAL | 0 refills | Status: DC
Start: 2016-10-12 — End: 2016-10-12

## 2016-10-12 MED ORDER — DOXYCYCLINE CALCIUM 50 MG/5ML PO SYRP: 100.0000 mg | ORAL_SOLUTION | Freq: Two times a day (BID) | ORAL | 0 refills | Status: AC

## 2016-10-12 NOTE — Progress Notes (Signed)

## 2016-10-12 NOTE — Addendum Note (Signed)
Addended by: Worthy RancherWEBB, Shaunna Rosetti B on: 10/12/2016 12:08 PM   Modules accepted: Orders

## 2016-11-04 ENCOUNTER — Ambulatory Visit (INDEPENDENT_AMBULATORY_CARE_PROVIDER_SITE_OTHER): Payer: BLUE CROSS/BLUE SHIELD | Admitting: Family Medicine

## 2016-11-04 ENCOUNTER — Encounter: Payer: Self-pay | Admitting: Family Medicine

## 2016-11-04 VITALS — BP 121/82 | HR 87 | Temp 98.4°F | Wt 198.0 lb

## 2016-11-04 DIAGNOSIS — J029 Acute pharyngitis, unspecified: Secondary | ICD-10-CM

## 2016-11-04 DIAGNOSIS — J3089 Other allergic rhinitis: Secondary | ICD-10-CM

## 2016-11-04 LAB — POCT RAPID STREP A (OFFICE): Rapid Strep A Screen: NEGATIVE

## 2016-11-04 MED ORDER — MONTELUKAST SODIUM 10 MG PO TABS: 10.0000 mg | ORAL_TABLET | Freq: Every day | ORAL | 0 refills | Status: DC

## 2016-11-04 MED ORDER — IPRATROPIUM BROMIDE 0.06 % NA SOLN: 2.0000 | NASAL | 6 refills | Status: DC | PRN

## 2016-11-04 MED ORDER — NAPROXEN 125 MG/5ML PO SUSP
500.0000 mg | Freq: Two times a day (BID) | ORAL | 0 refills | Status: AC
Start: 2016-11-04 — End: 2016-11-14

## 2016-11-04 NOTE — Progress Notes (Signed)
Mariah Moore is a 45 y.o. female who [facility] today for nasal pressure runny nose and congestion. Symptoms present off and on for over a month now. Patient has a history of chronic allergic rhinitis. She notes her current symptoms have worsened recently. She also notes a pressure and sore throat. She's tried some over-the-counter medications for 2 very control of her symptoms such as NyQuil and DayQuil. Additionally she continues her Flonase nasal spray as well as Zyrtec for chronic allergies. She notes her child was recently diagnosed with strep throat. She is concerned with her new sore throat that she may have strep throat. She notes mild cough and denies severe fever.   Past Medical History:  Diagnosis Date  . Allergy    Past Surgical History:  Procedure Laterality Date  . CESAREAN SECTION    . Fibriod removal     Social History  Substance Use Topics  . Smoking status: Never Smoker  . Smokeless tobacco: Not on file  . Alcohol use Not on file   family history includes Cancer in her father, mother, and paternal grandmother; Diabetes in her father; Heart attack in her maternal aunt; Hyperlipidemia in her mother; Hypertension in her maternal aunt and mother; Stroke in her maternal aunt.  ROS as above:  Medications: Current Outpatient Prescriptions  Medication Sig Dispense Refill  . cetirizine (ZYRTEC) 10 MG tablet Take 10 mg by mouth daily.    . fluticasone (FLONASE) 50 MCG/ACT nasal spray SHAKE WELL AND USE 2 SPRAYS IN EACH NOSTRIL DAILY 16 g 3  . polyethylene glycol powder (MIRALAX) powder Take 1 Container by mouth as needed.    . Prenatal MV-Min-Fe Fum-FA-DHA (PRENATAL 1 PO) Take by mouth.    . ranitidine (ZANTAC) 150 MG tablet Take 1 tablet (150 mg total) by mouth 2 (two) times daily. 60 tablet 5  . ipratropium (ATROVENT) 0.06 % nasal spray  Place 2 sprays into both nostrils every 4 (four) hours as needed for rhinitis. 10 mL 6  . montelukast (SINGULAIR) 10 MG tablet Take 1 tablet (10 mg total) by mouth at bedtime. 90 tablet 0  . naproxen (NAPROSYN) 125 MG/5ML suspension Take 20 mLs (500 mg total) by mouth 2 (two) times daily with a meal. 473 mL 0   No current facility-administered medications for this visit.    Allergies  Allergen Reactions  . Bee Venom Swelling  . Codeine Nausea And Vomiting  . Sulfa Antibiotics Nausea And Vomiting    Health Maintenance Health Maintenance  Topic Date Due  . HIV Screening  06/01/1988  . INFLUENZA VACCINE  07/27/2016  . MAMMOGRAM  01/28/2017  . PAP SMEAR  05/29/2017  . TETANUS/TDAP  10/27/2020     Exam:  BP 121/82   Pulse 87   Temp 98.4 F (36.9 C) (Oral)   Wt 198 lb (89.8 kg)   SpO2 99%   BMI 32.95 kg/m  Gen: Well NAD Nontoxic appearing HEENT: EOMI,  MMM clear nasal discharge. Posterior pharynx with mild cobblestoning. Left tympanic membrane was retracted without erythema or effusion. Right tympanic membrane was normal appearing. No cervical lymphadenopathy present. Mild goiter present also. Lungs: Normal work of breathing. CTABL Heart: RRR no MRG Abd: NABS, Soft. Nondistended, Nontender Exts: Brisk capillary refill, warm and well perfused.    Point-of-care rapid strep test was negative.    Assessment and Plan: 45 y.o. female with  Viral pharyngitis plus coexisting chronic allergic rhinitis.  Plan to treat viral pharyngitis symptomatically with Atrovent nasal spray and naproxen. Additionally will increase allergic rhinitis treatment by adding montelukast to Zyrtec and Flonase. Return or follow-up with PCP if not better.  If patient fails to improve with chronic rhinitis symptoms she may benefit from referral to ear nose and throat.   Orders Placed This Encounter  Procedures  . POCT rapid strep A    Discussed warning signs or symptoms. Please see discharge  instructions. Patient expresses understanding.

## 2016-11-04 NOTE — Patient Instructions (Signed)
Thank you for coming in today. Naproxen liquid twice daily for 7-10 days for pain control. Use Atrovent nasal spray up to 4 times daily for runny nose and postnasal drainage. Continue taking acid blocking medications. Add Singulair allergies to other allergy medicines. This medicine may be crushed or chewed.  Let me know if you do not feel better. Return as needed. Call or go to the emergency room if you get worse, have trouble breathing, have chest pains, or palpitations.   Pharyngitis Pharyngitis is redness, pain, and swelling (inflammation) of your pharynx.  CAUSES  Pharyngitis is usually caused by infection. Most of the time, these infections are from viruses (viral) and are part of a cold. However, sometimes pharyngitis is caused by bacteria (bacterial). Pharyngitis can also be caused by allergies. Viral pharyngitis may be spread from person to person by coughing, sneezing, and personal items or utensils (cups, forks, spoons, toothbrushes). Bacterial pharyngitis may be spread from person to person by more intimate contact, such as kissing.  SIGNS AND SYMPTOMS  Symptoms of pharyngitis include:   Sore throat.   Tiredness (fatigue).   Low-grade fever.   Headache.  Joint pain and muscle aches.  Skin rashes.  Swollen lymph nodes.  Plaque-like film on throat or tonsils (often seen with bacterial pharyngitis). DIAGNOSIS  Your health care provider will ask you questions about your illness and your symptoms. Your medical history, along with a physical exam, is often all that is needed to diagnose pharyngitis. Sometimes, a rapid strep test is done. Other lab tests may also be done, depending on the suspected cause.  TREATMENT  Viral pharyngitis will usually get better in 3-4 days without the use of medicine. Bacterial pharyngitis is treated with medicines that kill germs (antibiotics).  HOME CARE INSTRUCTIONS   Drink enough water and fluids to keep your urine clear or pale yellow.    Only take over-the-counter or prescription medicines as directed by your health care provider:   If you are prescribed antibiotics, make sure you finish them even if you start to feel better.   Do not take aspirin.   Get lots of rest.   Gargle with 8 oz of salt water ( tsp of salt per 1 qt of water) as often as every 1-2 hours to soothe your throat.   Throat lozenges (if you are not at risk for choking) or sprays may be used to soothe your throat. SEEK MEDICAL CARE IF:   You have large, tender lumps in your neck.  You have a rash.  You cough up green, yellow-brown, or bloody spit. SEEK IMMEDIATE MEDICAL CARE IF:   Your neck becomes stiff.  You drool or are unable to swallow liquids.  You vomit or are unable to keep medicines or liquids down.  You have severe pain that does not go away with the use of recommended medicines.  You have trouble breathing (not caused by a stuffy nose). MAKE SURE YOU:   Understand these instructions.  Will watch your condition.  Will get help right away if you are not doing well or get worse.   This information is not intended to replace advice given to you by your health care provider. Make sure you discuss any questions you have with your health care provider.   Document Released: 12/13/2005 Document Revised: 10/03/2013 Document Reviewed: 08/20/2013 Elsevier Interactive Patient Education [employer]2016 Elsevier Inc.

## 2016-12-02 DIAGNOSIS — Z23 Encounter for immunization: Secondary | ICD-10-CM | POA: Diagnosis not present

## 2017-01-06 DIAGNOSIS — H109 Unspecified conjunctivitis: Secondary | ICD-10-CM | POA: Diagnosis not present

## 2017-01-26 ENCOUNTER — Ambulatory Visit (INDEPENDENT_AMBULATORY_CARE_PROVIDER_SITE_OTHER): Payer: BLUE CROSS/BLUE SHIELD | Admitting: Family Medicine

## 2017-01-26 VITALS — BP 134/69 | HR 80 | Wt 199.0 lb

## 2017-01-26 DIAGNOSIS — S39012A Strain of muscle, fascia and tendon of lower back, initial encounter: Secondary | ICD-10-CM

## 2017-01-26 MED ORDER — OMEPRAZOLE 40 MG PO CPDR: 40.0000 mg | DELAYED_RELEASE_CAPSULE | Freq: Every day | ORAL | 3 refills | Status: DC

## 2017-01-26 MED ORDER — CYCLOBENZAPRINE HCL 5 MG PO TABS: 5.0000 mg | ORAL_TABLET | Freq: Every evening | ORAL | 1 refills | Status: DC | PRN

## 2017-01-26 NOTE — Progress Notes (Signed)
Mariah Moore is a 46 y.o. female who presents to [facility] today for left low back pain. Patient is a 5 day history of left low back pain occurring when she bent to pick an object off the ground. She felt a pop. She does note some occasional pain radiating down the posterior aspect of her leg to her posterior ankle. However she notes that the radiating pain down her leg is not very bothersome at all. She denies any weakness or numbness bowel bladder dysfunction or difficulty walking. She has moderate low back pain worse with activity better with rest. She's tried Aleve which helps a little. She denies fevers or chills nausea vomiting or diarrhea.   Past Medical History:  Diagnosis Date  . Allergy    Past Surgical History:  Procedure Laterality Date  . CESAREAN SECTION    . Fibriod removal     Social History  Substance Use Topics  . Smoking status: Never Smoker  . Smokeless tobacco: Not on file  . Alcohol use Not on file     ROS:  As above   Medications: Current Outpatient Prescriptions  Medication Sig Dispense Refill  . cetirizine (ZYRTEC) 10 MG tablet Take 10 mg by mouth daily.    . cyclobenzaprine (FLEXERIL) 5 MG tablet Take 1-2 tablets (5-10 mg total) by mouth at bedtime as needed for muscle spasms. 20 tablet 1  . fluticasone (FLONASE) 50 MCG/ACT nasal spray SHAKE WELL AND USE 2 SPRAYS IN EACH NOSTRIL DAILY 16 g 3  . ipratropium (ATROVENT) 0.06 % nasal spray Place 2 sprays into both nostrils every 4 (four) hours as needed for rhinitis. 10 mL 6  . montelukast (SINGULAIR) 10 MG tablet Take 1 tablet (10 mg total) by mouth at bedtime. 90 tablet 0  . omeprazole (PRILOSEC) 40 MG capsule Take 1 capsule (40 mg total) by mouth daily. 30 capsule 3  . polyethylene glycol powder (MIRALAX) powder Take 1 Container by mouth as needed.    . Prenatal MV-Min-Fe Fum-FA-DHA (PRENATAL 1 PO) Take by mouth.    . ranitidine (ZANTAC) 150 MG tablet Take  1 tablet (150 mg total) by mouth 2 (two) times daily. 60 tablet 5   No current facility-administered medications for this visit.    Allergies  Allergen Reactions  . Bee Venom Swelling  . Codeine Nausea And Vomiting  . Sulfa Antibiotics Nausea And Vomiting     Exam:  BP 134/69   Pulse 80   Wt 199 lb (90.3 kg)   BMI 33.12 kg/m  General: Well Developed, well nourished, and in no acute distress.  Neuro/Psych: Alert and oriented x3, extra-ocular muscles intact, able to move all 4 extremities, sensation grossly intact. Skin: Warm and dry, no rashes noted.  Respiratory: Not using accessory muscles, speaking in full sentences, trachea midline.  Cardiovascular: Pulses palpable, no extremity edema. Abdomen: Does not appear distended. MSK: L spine: Nontender to midline. Tender palpation left lumbar paraspinal muscle group and SI joint. Range of motion normal flexion over patient expresses pain when standing up from a flexed position. Normal extension. Patient experiences pain and mild limited motion for right lateral flexion and right rotation otherwise rotation and flexion range of motion is normal. Pulses capillary refill and sensation are intact distally. Negative slump test bilaterally.    No results found for this or any previous visit (from the past 48 hour(s)). No results found.    Assessment and Plan: 46 y.o. female with lumbosacral strain. There  may be some component of mild lumbar radiculopathy but certainly this is not very bothersome at this time. Plan for physical therapy NSAIDs Flexeril TENS unit and heating pad. Prescribe GI prophylaxis omeprazole for ulcer prevention on NSAIDs.    Orders Placed This Encounter  Procedures  . Ambulatory referral to Physical Therapy    Referral Priority:   Routine    Referral Type:   Physical Medicine    Referral Reason:   Specialty Services Required    Requested Specialty:   Physical Therapy    Number of Visits Requested:   1     Discussed warning signs or symptoms. Please see discharge instructions. Patient expresses understanding.

## 2017-01-26 NOTE — Patient Instructions (Addendum)
Thank you for coming in today. Attend PT.  Take omeprazole with naproxen.  Use flexeril sparingly.  Come back or go to the emergency room if you notice new weakness new numbness problems walking or bowel or bladder problems.  TENS UNIT: This is helpful for muscle pain and spasm.   Search and Purchase a TENS 7000 2nd edition at  www.tenspros.com or www.Amazon.com It should be less than $30.     TENS unit instructions: Do not shower or bathe with the unit on Turn the unit off before removing electrodes or batteries If the electrodes lose stickiness add a drop of water to the electrodes after they are disconnected from the unit and place on plastic sheet. If you continued to have difficulty, call the TENS unit company to purchase more electrodes. Do not apply lotion on the skin area prior to use. Make sure the skin is clean and dry as this will help prolong the life of the electrodes. After use, always check skin for unusual red areas, rash or other skin difficulties. If there are any skin problems, does not apply electrodes to the same area. Never remove the electrodes from the unit by pulling the wires. Do not use the TENS unit or electrodes other than as directed. Do not change electrode placement without consultating your therapist or physician. Keep 2 fingers with between each electrode. Wear time ratio is 2:1, on to off times.    For example on for 30 minutes off for 15 minutes and then on for 30 minutes off for 15 minutes

## 2017-02-15 ENCOUNTER — Ambulatory Visit: Payer: BLUE CROSS/BLUE SHIELD | Admitting: Rehabilitative and Restorative Service Providers"

## 2017-02-21 ENCOUNTER — Ambulatory Visit (INDEPENDENT_AMBULATORY_CARE_PROVIDER_SITE_OTHER): Payer: BLUE CROSS/BLUE SHIELD | Admitting: Rehabilitative and Restorative Service Providers"

## 2017-02-21 ENCOUNTER — Encounter: Payer: Self-pay | Admitting: Rehabilitative and Restorative Service Providers"

## 2017-02-21 DIAGNOSIS — R29898 Other symptoms and signs involving the musculoskeletal system: Secondary | ICD-10-CM | POA: Diagnosis not present

## 2017-02-21 DIAGNOSIS — M5416 Radiculopathy, lumbar region: Secondary | ICD-10-CM | POA: Diagnosis not present

## 2017-02-21 NOTE — Therapy (Signed)
[facility] [address] [place], [place], [address] Phone: [phone]   Fax:  [phone]  Physical Therapy Evaluation  Patient Details  Name: Mariah Moore MRN: [id number] Date of Birth: 02/27/1971 Referring Provider: Dr Clementeen GrahamEvan Corey   Encounter Date: 02/21/2017      PT End of Session - 02/21/17 1153    Visit Number 1   Number of Visits 12   Date for PT Re-Evaluation 04/04/17   PT Start Time 1015   PT Stop Time 1113   PT Time Calculation (min) 58 min   Activity Tolerance Patient tolerated treatment well      Past Medical History:  Diagnosis Date  . Allergy     Past Surgical History:  Procedure Laterality Date  . CESAREAN SECTION    . Fibriod removal      There were no vitals filed for this visit.       Subjective Assessment - 02/21/17 1024    Subjective Patient reports that she started having LBP after moving into her new house. Pain started ~ 01/13/17. she bent forward and felt a pull in the Lt LB going into the upper buttock Symptoms have improved but she continues to have some intermittent pain making it difficult to sit or lie down.    Pertinent History Denies any prior back problems   How long can you sit comfortably? with a pillow no limit; 10-15 min without lumbar support    How long can you stand comfortably? no limit   How long can you walk comfortably? no limit   Patient Stated Goals help to get rid of soreness and make the back not as tight - know what to do if the soreness hits    Currently in Pain? Yes   Pain Score 3    Pain Location Back   Pain Orientation Left;Lower   Pain Descriptors / Indicators Tightness;Aching   Pain Type Acute pain   Pain Radiating Towards LB to Lt hip into upper leg - stabbing in the buttock area at times   Pain Onset More than a month ago   Pain Frequency Intermittent   Aggravating Factors  sitting; lying down    Pain Relieving Factors walking; ice; heat; massager              [facility] - 02/21/17 0001      Assessment   Medical Diagnosis lumbosacral strain Lt    Referring Provider Dr Clementeen GrahamEvan Corey    Onset Date/Surgical Date 01/13/17   Hand Dominance Right   Next MD Visit PRN   Prior Therapy none     Precautions   Precautions None     Balance Screen   Has the patient fallen in the past 6 months No   Has the patient had a decrease in activity level because of a fear of falling?  No   Is the patient reluctant to leave their home because of a fear of falling?  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    Leisure just moving into new home; household chores; child care; treadmill ~ 15 min/day      Observation/Other Assessments   Focus on Therapeutic Outcomes (FOTO)  33% limitation      Sensation  Additional Comments WFL's      Posture/Postural Control   Posture Comments head forward; shoulders rounded and elevated; increased thoracic kyphosis; hips flexed      AROM   Overall AROM Comments limited end ranges bilat hips    AROM Assessment Site --  pull and discomfort all motions > ext/Lt lat flex/Lt rot    Lumbar Flexion 75%   Lumbar Extension 305   Lumbar - Right Side Bend 65%   Lumbar - Left Side Bend 60%   Lumbar - Right Rotation 35%   Lumbar - Left Rotation 30%     Strength   Overall Strength Comments 5/5 bilat LE's weak core      Flexibility   Hamstrings tight Rt 70 deg; Lt 75 deg   Quadriceps tight bilat ~ 100 deg    ITB tight Rt > Lt    Piriformis tight Lt > Rt      Palpation   Spinal mobility WFL's lumbar spine    SI assessment  tender to palpation Lt    Palpation comment tight Lt lumbar paraspinals; QL; piriformis; hip abductors                    [facility] Adult PT Treatment/Exercise - 02/21/17 0001      Self-Care    Self-Care --  initiated back education      Lumbar Exercises: Stretches   Passive Hamstring Stretch 3 reps;30 seconds   Prone on Elbows Stretch 1 rep;60 seconds   Quad Stretch 3 reps;30 seconds   ITB Stretch 3 reps;30 seconds   Piriformis Stretch 3 reps;30 seconds   Piriformis Stretch Limitations piriformis - travell; plus slight angle knee to opposite shd      Moist Heat Therapy   Number Minutes Moist Heat 20 Minutes   Moist Heat Location Lumbar Spine;Hip  Lt     Programme researcher, broadcasting/film/video Location Bilat lumbar; Lt hip piriformis/hip abductor    Electrical Stimulation Action IFC   Electrical Stimulation Parameters to tolerance   Electrical Stimulation Goals Pain;Tone                PT Education - 02/21/17 1054    Education provided Yes   Education Details HEP; TENS; back care    Person(s) Educated Patient   Methods Explanation;Demonstration;Tactile cues;Verbal cues;Handout   Comprehension Verbalized understanding;Returned demonstration;Verbal cues required;Tactile cues required             PT Long Term Goals - 02/21/17 1330      PT LONG TERM GOAL #1   Title Improve mobility and ROM through lumbar spine and bilat hips to WNL's throughout 04/04/17   Time 6   Period Weeks   Status New     PT LONG TERM GOAL #2   Title Decrease pain and tightness in Lt lumbar and hip by 75-90% 04/04/17   Time 6   Period Weeks   Status New     PT LONG TERM GOAL #3   Title Patient reports return to all normal functional activities including walking program 04/04/17   Time 6   Period Weeks   Status New     PT LONG TERM GOAL #4   Title Independent in HEP 04/04/17   Time 6   Period Weeks   Status New     PT LONG TERM GOAL #5   Title Decrease FOTO to </= 18% limitation 04/04/17   Time 6   Period Weeks   Status New  Plan - 02/21/17 1154    Clinical Impression Statement Mariah Moore presents with resolving Lt lumbar pain with radicular pain  into the Lt buttock. She has limited trunk and LE mobilty and ROM. Patient has muscular tightness to palpation through the Lt lumbar and hip areas. She has limited functional activities and tightness and discomfort on an intermittent basis Patient will benefit form PT to address problems identified.    Rehab Potential Good   PT Frequency 2x / week   PT Duration 6 weeks   PT Treatment/Interventions Patient/family education;ADLs/Self Care Home Management;Neuromuscular re-education;Cryotherapy;Electrical Stimulation;Iontophoresis 4mg /ml Dexamethasone;Moist Heat;Traction;Ultrasound;Dry needling;Manual techniques;Therapeutic activities;Therapeutic exercise   PT Next Visit Plan progress with stretching and core stabilization; manual work Lt lumbar to hip areas; modalities as indicated   Consulted and Agree with Plan of Care Patient      Patient will benefit from skilled therapeutic intervention in order to improve the following deficits and impairments:  Postural dysfunction, Improper body mechanics, Pain, Increased fascial restricitons, Increased muscle spasms, Decreased range of motion, Decreased mobility, Decreased activity tolerance  Visit Diagnosis: Radiculopathy, lumbar region - Plan: PT plan of care cert/re-cert  Other symptoms and signs involving the musculoskeletal system - Plan: PT plan of care cert/re-cert     Problem List Patient Active Problem List   Diagnosis Date Noted  . Chest pain 08/16/2016  . Multiple thyroid nodules 08/03/2016  . Early menopause occurring in patient age younger than 60 years 07/30/2016  . Problems with swallowing 07/28/2016  . Thyromegaly 07/28/2016  . Esophageal reflux 07/28/2016  . Missed period 07/28/2016  . Obesity, unspecified 05/29/2014  . Miscarriage within last 12 months 05/29/2014    Celyn Rober Minion PT, MPH  02/21/2017, 1:36 PM  [facility] [address] [place], [place],  [address] Phone: [phone]   Fax:  [phone]  Name: Mariah Moore MRN: [id number] Date of Birth: 11-12-1971

## 2017-02-21 NOTE — Patient Instructions (Signed)
Abdominal Bracing With Pelvic Floor (Hook-Lying) three part core     With neutral spine, tighten pelvic floor and abdominals sucking belly button to back bone; thghten muscles in low back at waist  Hold 10 sec . Repeat _10__ times. Do _several__ times a day. Progress to do this in sitting; standing; walking and with functional activities   On Elbows (Prone)    Rise up on elbows as high as possible, keeping hips on floor. Hold _1-2 min - No pain . Repeat ___1-2_ times per set. 1-2 times/day  HIP: Hamstrings - Supine   Place strap around foot. Raise leg up, keeping knee straight.  Bend opposite knee to protect back if indicated. Hold 30 seconds. 3 reps per set, 2-3 sets per day     Outer Hip Stretch: Reclined IT Band Stretch (Strap)   Strap around one foot, pull leg across body until you feel a pull or stretch, with shoulders on mat. Hold for 30 seconds. Repeat 3 times each leg. 2-3 times/day.  Piriformis Stretch   Lying on back, pull right knee toward opposite shoulder. Hold 30 seconds. Repeat 3 times. Do 2-3 sessions per day.   Quads / HF, Prone   Lie face down. Grasp one ankle with same-side hand. Use towel if needed to reach. Gently pull foot toward buttock.  Hold 30 seconds. Repeat 3 times per session. Do 2-3 sessions per day.   TENS UNIT: This is helpful for muscle pain and spasm.   Search and Purchase a TENS 7000 2nd edition at www.tenspros.com. It should be less than $30.     TENS unit instructions: Do not shower or bathe with the unit on Turn the unit off before removing electrodes or batteries If the electrodes lose stickiness add a drop of water to the electrodes after they are disconnected from the unit and place on plastic sheet. If you continued to have difficulty, call the TENS unit company to purchase more electrodes. Do not apply lotion on the skin area prior to use. Make sure the skin is clean and dry as this will help prolong the life of the  electrodes. After use, always check skin for unusual red areas, rash or other skin difficulties. If there are any skin problems, does not apply electrodes to the same area. Never remove the electrodes from the unit by pulling the wires. Do not use the TENS unit or electrodes other than as directed. Do not change electrode placement without consultating your therapist or physician. Keep 2 fingers with between each electrode.   Sleeping on Back  Place pillow under knees. A pillow with cervical support and a roll around waist are also helpful. Copyright  VHI. All rights reserved.  Sleeping on Side Place pillow between knees. Use cervical support under neck and a roll around waist as needed. Copyright  VHI. All rights reserved.   Sleeping on Stomach   If this is the only desirable sleeping position, place pillow under lower legs, and under stomach or chest as needed.  Posture - Sitting   Sit upright, head facing forward. Try using a roll to support lower back. Keep shoulders relaxed, and avoid rounded back. Keep hips level with knees. Avoid crossing legs for long periods. Stand to Sit / Sit to Stand   To sit: Bend knees to lower self onto front edge of chair, then scoot back on seat. To stand: Reverse sequence by placing one foot forward, and scoot to front of seat. Use rocking motion to stand  up.   Work Height and Reach  Ideal work height is no more than 2 to 4 inches below elbow level when standing, and at elbow level when sitting. Reaching should be limited to arm's length, with elbows slightly bent.  Bending  Bend at hips and knees, not back. Keep feet shoulder-width apart.    Posture - Standing   Good posture is important. Avoid slouching and forward head thrust. Maintain curve in low back and align ears over shoul- ders, hips over ankles.  Alternating Positions   Alternate tasks and change positions frequently to reduce fatigue and muscle tension. Take rest  breaks. Computer Work   Position work to Art gallery manager. Use proper work and seat height. Keep shoulders back and down, wrists straight, and elbows at right angles. Use chair that provides full back support. Add footrest and lumbar roll as needed.  Getting Into / Out of Car  Lower self onto seat, scoot back, then bring in one leg at a time. Reverse sequence to get out.  Dressing  Lie on back to pull socks or slacks over feet, or sit and bend leg while keeping back straight.    Housework - Sink  Place one foot on ledge of cabinet under sink when standing at sink for prolonged periods.   Pushing / Pulling  Pushing is preferable to pulling. Keep back in proper alignment, and use leg muscles to do the work.  Deep Squat   Squat and lift with both arms held against upper trunk. Tighten stomach muscles without holding breath. Use smooth movements to avoid jerking.  Avoid Twisting   Avoid twisting or bending back. Pivot around using foot movements, and bend at knees if needed when reaching for articles.  Carrying Luggage   Distribute weight evenly on both sides. Use a cart whenever possible. Do not twist trunk. Move body as a unit.   Lifting Principles .Maintain proper posture and head alignment. .Slide object as close as possible before lifting. .Move obstacles out of the way. .Test before lifting; ask for help if too heavy. .Tighten stomach muscles without holding breath. .Use smooth movements; do not jerk. .Use legs to do the work, and pivot with feet. .Distribute the work load symmetrically and close to the center of trunk. .Push instead of pull whenever possible.   Ask For Help   Ask for help and delegate to others when possible. Coordinate your movements when lifting together, and maintain the low back curve.  Log Roll   Lying on back, bend left knee and place left arm across chest. Roll all in one movement to the right. Reverse to roll to the left. Always move  as one unit. Housework - Sweeping  Use long-handled equipment to avoid stooping.   Housework - Wiping  Position yourself as close as possible to reach work surface. Avoid straining your back.  Laundry - Unloading Wash   To unload small items at bottom of washer, lift leg opposite to arm being used to reach.  Gardening - Raking  Move close to area to be raked. Use arm movements to do the work. Keep back straight and avoid twisting.     Cart  When reaching into cart with one arm, lift opposite leg to keep back straight.   Getting Into / Out of Bed  Lower self to lie down on one side by raising legs and lowering head at the same time. Use arms to assist moving without twisting. Bend both knees to roll onto back  if desired. To sit up, start from lying on side, and use same move-ments in reverse. Housework - Vacuuming  Hold the vacuum with arm held at side. Step back and forth to move it, keeping head up. Avoid twisting.   Laundry - Armed forces training and education officer so that bending and twisting can be avoided.   Laundry - Unloading Dryer  Squat down to reach into clothes dryer or use a reacher.  Gardening - Weeding / Psychiatric nurse or Kneel. Knee pads may be helpful.

## 2017-02-28 ENCOUNTER — Encounter: Payer: BLUE CROSS/BLUE SHIELD | Admitting: Physical Therapy

## 2017-03-03 ENCOUNTER — Encounter: Payer: BLUE CROSS/BLUE SHIELD | Admitting: Physical Therapy

## 2017-03-07 ENCOUNTER — Encounter: Payer: BLUE CROSS/BLUE SHIELD | Admitting: Physical Therapy

## 2017-03-10 ENCOUNTER — Ambulatory Visit (INDEPENDENT_AMBULATORY_CARE_PROVIDER_SITE_OTHER): Payer: BLUE CROSS/BLUE SHIELD | Admitting: Physical Therapy

## 2017-03-10 DIAGNOSIS — M5416 Radiculopathy, lumbar region: Secondary | ICD-10-CM | POA: Diagnosis not present

## 2017-03-10 DIAGNOSIS — R29898 Other symptoms and signs involving the musculoskeletal system: Secondary | ICD-10-CM

## 2017-03-10 NOTE — Therapy (Addendum)
[address] [place], [place], [address] Phone: [phone]   Fax:  [phone]  Physical Therapy Treatment  Patient Details  Name: Mariah Moore MRN: [id number] Date of Birth: August 29, 1971 Referring Provider: Dr. Georgina Snell  Encounter Date: 03/10/2017      PT End of Session - 03/10/17 1024    Visit Number 2   Number of Visits 12   Date for PT Re-Evaluation 04/04/17   PT Start Time 1018   PT Stop Time 1112   PT Time Calculation (min) 54 min      Past Medical History:  Diagnosis Date  . Allergy     Past Surgical History:  Procedure Laterality Date  . CESAREAN SECTION    . Fibriod removal      There were no vitals filed for this visit.      Subjective Assessment - 03/10/17 1025    Subjective Pt reports the stretches have really helped decrease her pain.  She has been doing HEP 1-2x/day.  She is no longer taking pain medicine.  She is now unpacked from recent move.  She has bought strap for stretching and TENS unit.     Currently in Pain? No/denies   Pain Score 1    Pain Location Back   Pain Orientation Left;Lower   Pain Descriptors / Indicators Aching;Sore   Aggravating Factors  bending over   Pain Relieving Factors walking, ice, heat, stretch            [facility] PT Assessment - 03/10/17 0001      Assessment   Medical Diagnosis lumbosacral strain Lt    Referring Provider Dr. Georgina Snell   Onset Date/Surgical Date 01/13/17   Hand Dominance Right   Next MD Visit PRN   Prior Therapy none         [facility] Adult PT Treatment/Exercise - 03/10/17 0001      Lumbar Exercises: Stretches   Passive Hamstring Stretch 2 reps;30 seconds   Prone on Elbows Stretch 1 rep;60 seconds   Quad Stretch 30 seconds;2 reps   ITB Stretch 2 reps;30 seconds   ITB Stretch Limitations VC for improved form.    Piriformis Stretch 2 reps;30 seconds   Piriformis Stretch Limitations shown seated and alternative version in supine      Lumbar Exercises: Aerobic   Stationary Bike NuStep L4: 5.5 min      Lumbar Exercises: Supine   Ab Set 5 reps;5 seconds   Clam 5 reps;2 seconds   Bent Knee Raise 10 reps  with ab set   Bridge 10 reps   Other Supine Lumbar Exercises pt educated on supine to/from sit via log roll.  Pt returned demo with VC.,      Moist Heat Therapy   Number Minutes Moist Heat 15 Minutes   Moist Heat Location Lumbar Spine     Electrical Stimulation   Electrical Stimulation Location Lt hip/low back   Electrical Stimulation Action IFC   Electrical Stimulation Parameters to tolerance    Electrical Stimulation Goals Pain                PT Education - 03/10/17 1100    Education provided Yes   Education Details HEP - added TA ex.    Person(s) Educated Patient   Methods Explanation;Handout   Comprehension Verbalized understanding;Returned demonstration             PT Long Term Goals - 03/10/17 1043      PT LONG TERM  GOAL #1   Title Improve mobility and ROM through lumbar spine and bilat hips to WNL's throughout 04/04/17   Time 6   Period Weeks   Status On-going     PT LONG TERM GOAL #2   Title Decrease pain and tightness in Lt lumbar and hip by 75-90% 04/04/17   Time 6   Period Weeks   Status On-going  50 % reduction reported on 03/10/17     PT LONG TERM GOAL #3   Title Patient reports return to all normal functional activities including walking program 04/04/17   Time 6   Period Weeks   Status Partially Met  walking on treadmill 15 min.      PT LONG TERM GOAL #4   Title Independent in HEP 04/04/17   Time 6   Period Weeks   Status On-going     PT LONG TERM GOAL #5   Title Decrease FOTO to </= 18% limitation 04/04/17   Time 6   Period Weeks   Status On-going               Plan - 03/10/17 1046    Clinical Impression Statement Pt reporting 50% reduction in pain in lumbar spine and hip.  She is now walking on treadmill for 15 min daily; has partially met LTG #3.   She tolerated all exercises well, requiring only minor cues to correct form. Pt reported reduction in pain at end of session.     Rehab Potential Good   PT Frequency 2x / week   PT Duration 6 weeks   PT Treatment/Interventions Patient/family education;ADLs/Self Care Home Management;Neuromuscular re-education;Cryotherapy;Electrical Stimulation;Iontophoresis 31m/ml Dexamethasone;Moist Heat;Traction;Ultrasound;Dry needling;Manual techniques;Therapeutic activities;Therapeutic exercise   PT Next Visit Plan Assess response to TA exercises added.    Consulted and Agree with Plan of Care Patient      Patient will benefit from skilled therapeutic intervention in order to improve the following deficits and impairments:  Postural dysfunction, Improper body mechanics, Pain, Increased fascial restricitons, Increased muscle spasms, Decreased range of motion, Decreased mobility, Decreased activity tolerance  Visit Diagnosis: Radiculopathy, lumbar region  Other symptoms and signs involving the musculoskeletal system     Problem List Patient Active Problem List   Diagnosis Date Noted  . Chest pain 08/16/2016  . Multiple thyroid nodules 08/03/2016  . Early menopause occurring in patient age younger than 438years 07/30/2016  . Problems with swallowing 07/28/2016  . Thyromegaly 07/28/2016  . Esophageal reflux 07/28/2016  . Missed period 07/28/2016  . Obesity, unspecified 05/29/2014  . Miscarriage within last 12 months 05/29/2014   Mariah Moore PTA 03/10/17 1:23 PM  [place] [address] [address]Phone: [phone]  Fax:  [phone] Name: CWillard MadrigalMRN: [id number] of Birth: 610/30/1972 PHYSICAL THERAPY DISCHARGE SUMMARY  Visits from Start of Care: 2  Current functional level related to goals / functional outcomes: See last progress note for status at last appointment.    Remaining  deficits: Unknown    Education / Equipment: HEP Plan: Patient agrees to discharge.  Patient goals were not met. Patient is being discharged due to not returning since the last visit.  ?????    Celyn P. HHelene KelpPT, MPH 04/29/17 9:39 AM

## 2017-03-10 NOTE — Patient Instructions (Signed)
  Abdominal Bracing With Pelvic Floor (Hook-Lying)   With neutral spine, tighten pelvic floor and abdominals. Hold 10 seconds. Repeat __10_ times. Do _1__ times a day.   Knee to Chest: Transverse Plane Stability   Bring one knee up, then return. Be sure pelvis does not roll side to side. Keep pelvis still. Lift knee __10_ times each leg. Restabilize pelvis. Repeat with other leg. Do _1-2__ sets, _1__ times per day.  Hip External Rotation With Pillow: Transverse Plane Stability   Keep both knees bent, engage abdominals. Slowly roll bent knee out. Be sure pelvis does not rotate. Do _10__ times. Restabilize pelvis. Repeat with other leg. Do _1-2__ sets, _1__ times per day.  Heel Slide: 4-10 Inches - Transverse Plane Stability   [facility] [address] [place], [place] [address]  [phone] (office) [phone] (fax)

## 2017-03-14 ENCOUNTER — Encounter: Payer: BLUE CROSS/BLUE SHIELD | Admitting: Physical Therapy

## 2017-03-14 ENCOUNTER — Ambulatory Visit (INDEPENDENT_AMBULATORY_CARE_PROVIDER_SITE_OTHER): Payer: BLUE CROSS/BLUE SHIELD | Admitting: Physician Assistant

## 2017-03-14 ENCOUNTER — Encounter: Payer: Self-pay | Admitting: Physician Assistant

## 2017-03-14 VITALS — BP 134/79 | HR 74 | Temp 97.9°F | Ht 65.0 in | Wt 199.0 lb

## 2017-03-14 DIAGNOSIS — R05 Cough: Secondary | ICD-10-CM

## 2017-03-14 DIAGNOSIS — M940 Chondrocostal junction syndrome [Tietze]: Secondary | ICD-10-CM

## 2017-03-14 DIAGNOSIS — J0100 Acute maxillary sinusitis, unspecified: Secondary | ICD-10-CM

## 2017-03-14 DIAGNOSIS — R059 Cough, unspecified: Secondary | ICD-10-CM

## 2017-03-14 MED ORDER — AMOXICILLIN-POT CLAVULANATE 875-125 MG PO TABS: 1.0000 | ORAL_TABLET | Freq: Two times a day (BID) | ORAL | 0 refills | Status: DC

## 2017-03-14 MED ORDER — FLUTICASONE PROPIONATE 50 MCG/ACT NA SUSP: NASAL | 11 refills | Status: DC

## 2017-03-14 MED ORDER — BENZONATATE 200 MG PO CAPS: 200.0000 mg | ORAL_CAPSULE | Freq: Two times a day (BID) | ORAL | 0 refills | Status: DC | PRN

## 2017-03-14 NOTE — Patient Instructions (Signed)

## 2017-03-14 NOTE — Progress Notes (Signed)
   Subjective:    Patient ID: Mariah Moore, female    DOB: 04/10/1971, 46 y.o.   MRN: [id number]  HPI  Pt is a 46 yo female who presents to the clinic with 2 weeks of upper respiratory symptoms of cough, nasal congestion, sinus pressure, headache, ear pressure, ST. Her husband had similar symptoms but his got better. Her symptoms are persistent with more sinus pressure and facial pain in the last few days. At night cough gets worse.  No fever, chills, body aches, wheezing. Her chest does feel tight at times and sometimes a deep breath hurts.    Review of Systems See HPI.     Objective:   Physical Exam  Constitutional: She is oriented to person, place, and time. She appears well-developed and well-nourished.  HENT:  Head: Normocephalic and atraumatic.  Right Ear: External ear normal.  Left Ear: External ear normal.  TM"s clear.  Nasal turbinates red and swollen.  Tenderness over maxillary sinuses to palpation.  Oropharynx erythematous.   Eyes: Conjunctivae are normal. Right eye exhibits no discharge. Left eye exhibits no discharge.  Neck: Normal range of motion. Neck supple.  Cardiovascular: Normal rate, regular rhythm and normal heart sounds.   Pulmonary/Chest: Effort normal and breath sounds normal. She has no wheezes.  Lymphadenopathy:    She has no cervical adenopathy.  Neurological: She is alert and oriented to person, place, and time.  Psychiatric: She has a normal mood and affect. Her behavior is normal.          Assessment & Plan:  Marland Kitchen.Marland Kitchen.[name] was seen today for uri.  Diagnoses and all orders for this visit:  Acute non-recurrent maxillary sinusitis -     fluticasone (FLONASE) 50 MCG/ACT nasal spray; SHAKE WELL AND USE 2 SPRAYS IN EACH NOSTRIL DAILY -     amoxicillin-clavulanate (AUGMENTIN) 875-125 MG tablet; Take 1 tablet by mouth 2 (two) times daily. For 10 days.  Cough -     benzonatate (TESSALON) 200 MG capsule; Take 1 capsule (200 mg total) by mouth 2 (two)  times daily as needed for cough.  Costochondritis   Discussed symptomatic care. Continue using delsym at bedtime. Follow up as needed.

## 2017-03-17 ENCOUNTER — Encounter: Payer: BLUE CROSS/BLUE SHIELD | Admitting: Physical Therapy

## 2017-03-18 ENCOUNTER — Encounter: Payer: Self-pay | Admitting: Physician Assistant

## 2017-03-18 MED ORDER — PREDNISONE 50 MG PO TABS: ORAL_TABLET | ORAL | 0 refills | Status: DC

## 2017-05-13 ENCOUNTER — Other Ambulatory Visit: Payer: Self-pay | Admitting: Physician Assistant

## 2017-05-13 DIAGNOSIS — Z1231 Encounter for screening mammogram for malignant neoplasm of breast: Secondary | ICD-10-CM

## 2017-05-18 ENCOUNTER — Other Ambulatory Visit: Payer: Self-pay | Admitting: Physician Assistant

## 2017-05-18 ENCOUNTER — Ambulatory Visit (INDEPENDENT_AMBULATORY_CARE_PROVIDER_SITE_OTHER): Payer: BLUE CROSS/BLUE SHIELD

## 2017-05-18 DIAGNOSIS — Z1231 Encounter for screening mammogram for malignant neoplasm of breast: Secondary | ICD-10-CM

## 2017-05-18 DIAGNOSIS — Z0000 Encounter for general adult medical examination without abnormal findings: Secondary | ICD-10-CM

## 2017-10-10 DIAGNOSIS — Z23 Encounter for immunization: Secondary | ICD-10-CM | POA: Diagnosis not present

## 2017-12-14 ENCOUNTER — Ambulatory Visit (INDEPENDENT_AMBULATORY_CARE_PROVIDER_SITE_OTHER): Payer: BLUE CROSS/BLUE SHIELD | Admitting: Physician Assistant

## 2017-12-14 ENCOUNTER — Encounter: Payer: Self-pay | Admitting: Physician Assistant

## 2017-12-14 VITALS — BP 132/80 | HR 86 | Temp 98.2°F | Wt 199.0 lb

## 2017-12-14 DIAGNOSIS — H9201 Otalgia, right ear: Secondary | ICD-10-CM

## 2017-12-14 DIAGNOSIS — J069 Acute upper respiratory infection, unspecified: Secondary | ICD-10-CM | POA: Diagnosis not present

## 2017-12-14 DIAGNOSIS — J209 Acute bronchitis, unspecified: Secondary | ICD-10-CM

## 2017-12-14 LAB — POCT RAPID STREP A (OFFICE): RAPID STREP A SCREEN: NEGATIVE

## 2017-12-14 MED ORDER — ALBUTEROL SULFATE (2.5 MG/3ML) 0.083% IN NEBU
2.5000 mg | INHALATION_SOLUTION | Freq: Once | RESPIRATORY_TRACT | Status: AC
  Administered 2017-12-14: 2.5 mg via RESPIRATORY_TRACT

## 2017-12-14 MED ORDER — ALBUTEROL SULFATE HFA 108 (90 BASE) MCG/ACT IN AERS: 1.0000 | INHALATION_SPRAY | RESPIRATORY_TRACT | 0 refills | Status: DC | PRN

## 2017-12-14 MED ORDER — PREDNISONE 50 MG PO TABS: ORAL_TABLET | ORAL | 0 refills | Status: DC

## 2017-12-14 MED ORDER — HYDROCOD POLST-CPM POLST ER 10-8 MG/5ML PO SUER: 2.5000 mL | Freq: Every evening | ORAL | 0 refills | Status: AC | PRN

## 2017-12-14 NOTE — Progress Notes (Signed)
HPI:                                                                Mariah Moore is a 46 y.o. female who [facility] today for sore throat and cough  URI   This is a new problem. The current episode started in the past 7 days. The problem has been gradually worsening. There has been no fever. Associated symptoms include congestion, coughing, ear pain (right), rhinorrhea and a sore throat. Pertinent negatives include no abdominal pain, diarrhea, joint pain, rash or vomiting. She has tried nothing for the symptoms.    Past Medical History:  Diagnosis Date  . Allergy    Past Surgical History:  Procedure Laterality Date  . CESAREAN SECTION    . Fibriod removal     Social History   Tobacco Use  . Smoking status: Never Smoker  . Smokeless tobacco: Never Used  Substance Use Topics  . Alcohol use: Not on file   family history includes Cancer in her father, mother, and paternal grandmother; Diabetes in her father; Heart attack in her maternal aunt; Hyperlipidemia in her mother; Hypertension in her maternal aunt and mother; Stroke in her maternal aunt.  ROS: negative except as noted in the HPI  Medications: Current Outpatient Medications  Medication Sig Dispense Refill  . cetirizine (ZYRTEC) 10 MG tablet Take 10 mg by mouth daily.    . Multiple Vitamin (MULTIVITAMIN) capsule Take 1 capsule by mouth daily.    Marland Kitchen. albuterol (PROVENTIL HFA;VENTOLIN HFA) 108 (90 Base) MCG/ACT inhaler Inhale 1 puff into the lungs every 4 (four) hours as needed for wheezing or shortness of breath. 1 Inhaler 0  . azithromycin (ZITHROMAX) 250 MG tablet Take 2 tablets now and then one tablet for 4 days. 6 tablet 0  . ipratropium (ATROVENT) 0.06 % nasal spray Place 2 sprays into both nostrils every 4 (four) hours as needed for rhinitis. 10 mL 6  . polyethylene glycol powder (MIRALAX) powder Take 1 Container by mouth as needed.     No current  facility-administered medications for this visit.    Allergies  Allergen Reactions  . Bee Venom Swelling  . Codeine Nausea And Vomiting  . Sulfa Antibiotics Nausea And Vomiting       Objective:  BP 132/80   Pulse 86   Temp 98.2 F (36.8 C) (Oral)   Wt 199 lb (90.3 kg)   BMI 33.12 kg/m  Gen:  alert, not ill-appearing, no distress, appropriate for age, obese female HEENT: head normocephalic without obvious abnormality, conjunctiva and cornea clear, nasal mucosa edematous, TM's clear bilaterally, oropharynx with erythema, no exudates, uvula midline, neck supple, trachea midline Pulm: Normal work of breathing, normal phonation, coarse breath sounds bilaterally CV: Normal rate, regular rhythm, s1 and s2 distinct, no murmurs, clicks or rubs  Neuro: alert and oriented x 3, no tremor MSK: extremities atraumatic, normal gait and station Skin: intact, no rashes on exposed skin, no jaundice, no cyanosis  No flowsheet data found.   No results found for this or any previous visit (from the past 72 hour(s)). No results found.    Assessment and Plan: 46 y.o. female with   1. Acute bronchitis, unspecified organism - Nebulizer given  in office today for coarse breath sounds. Vital signs reviewed and normal. Low suspicion for pneumonia - afebrile, no tachycardia, symptoms present for <1 week, no risk factors. Explained bronchitis is majority of the time a viral illness. Prednisone burst and albuterol prn. Cough suppressant at bedtime. Encouraged symptomatic care  - chlorpheniramine-HYDROcodone (TUSSIONEX) 10-8 MG/5ML SUER; Take 2.5-5 mLs by mouth at bedtime as needed for up to 5 days for cough (cough, will cause drowsiness.).  Dispense: 115 mL; Refill: 0 - albuterol (PROVENTIL HFA;VENTOLIN HFA) 108 (90 Base) MCG/ACT inhaler; Inhale 1 puff into the lungs every 4 (four) hours as needed for wheezing or shortness of breath.  Dispense: 1 Inhaler; Refill: 0 - albuterol (PROVENTIL) (2.5 MG/3ML)  0.083% nebulizer solution 2.5 mg  2. Otalgia of right ear - no evidence of otitis media on exam. Likely eustachian tube dysfunction / serous effusion from the URI. Active surveillance  3. Acute URI of multiple sites - patient requested Strep test. POCT rapid strep A negative. Centor score 1 - symptomatic care with Atrovent, saline rinses, Mucinex, Tylenol, and salt water gargles   Patient education and anticipatory guidance given Patient agrees with treatment plan Follow-up as needed if symptoms worsen or fail to improve  Levonne Hubertharley E. Cummings PA-C

## 2017-12-14 NOTE — Patient Instructions (Addendum)
-   Prednisone daily for 5 days - Tylenol 1000 mg every 8 hours as needed for fever and pain - Atrovent nasal spray up to 4 times daily and saline rinses for congestion - Mucinex to make cough more productive - Tussionex at bedtime as needed for cough - Albuterol inhaler 1-2 puffs every 4-6 hours as needed for shortness of breath and wheezing - Salt water gargles and Cepacol lozenges for sore throat - Drink plenty of fluids  - If no improvement of ear pain in the next 48 hours, contact our office

## 2017-12-16 ENCOUNTER — Encounter: Payer: Self-pay | Admitting: Physician Assistant

## 2017-12-16 DIAGNOSIS — J019 Acute sinusitis, unspecified: Secondary | ICD-10-CM

## 2017-12-16 MED ORDER — CEFUROXIME AXETIL 250 MG PO TABS: 250.0000 mg | ORAL_TABLET | Freq: Two times a day (BID) | ORAL | 0 refills | Status: DC

## 2017-12-21 ENCOUNTER — Encounter: Payer: Self-pay | Admitting: Physician Assistant

## 2017-12-21 ENCOUNTER — Ambulatory Visit (INDEPENDENT_AMBULATORY_CARE_PROVIDER_SITE_OTHER): Payer: BLUE CROSS/BLUE SHIELD | Admitting: Physician Assistant

## 2017-12-21 VITALS — BP 138/75 | HR 82 | Temp 98.5°F | Resp 16 | Ht 65.0 in | Wt 195.0 lb

## 2017-12-21 DIAGNOSIS — J029 Acute pharyngitis, unspecified: Secondary | ICD-10-CM

## 2017-12-21 DIAGNOSIS — J0100 Acute maxillary sinusitis, unspecified: Secondary | ICD-10-CM | POA: Diagnosis not present

## 2017-12-21 MED ORDER — IPRATROPIUM BROMIDE 0.06 % NA SOLN: 2.0000 | NASAL | 6 refills | Status: DC | PRN

## 2017-12-21 MED ORDER — AZITHROMYCIN 250 MG PO TABS: ORAL_TABLET | ORAL | 0 refills | Status: DC

## 2017-12-21 NOTE — Progress Notes (Signed)
   Subjective:    Patient ID: Mariah Moore, female    DOB: 11/17/1971, 46 y.o.   MRN: [id number]  HPI  Patient is a 46 year old female who presents to the clinic to follow-up after diagnosis of acute sinusitis/acute bronchitis on 12/19 in clinic.  She was given prednisone, Tessalon Perles, Hycodan, Atrovent, and albuterol.  She called back and was given Ceftin for 7 days.  She has 2 days left of Ceftin. Pt admits she never picked up atrovent.  She does feel about 30% better.  She continues to have a dry cough, right ear pain, sinus pressure and sinus drainage.  When she blows her nose the color is green to yellow still.  She denies any fever, chills, body aches.  She denies any wheezing or shortness of breath.  .. Active Ambulatory Problems    Diagnosis Date Noted  . Obesity, unspecified 05/29/2014  . Miscarriage within last 12 months 05/29/2014  . Problems with swallowing 07/28/2016  . Thyromegaly 07/28/2016  . Esophageal reflux 07/28/2016  . Missed period 07/28/2016  . Early menopause occurring in patient age younger than 7245 years 07/30/2016  . Multiple thyroid nodules 08/03/2016  . Chest pain 08/16/2016   Resolved Ambulatory Problems    Diagnosis Date Noted  . No Resolved Ambulatory Problems   Past Medical History:  Diagnosis Date  . Allergy       Review of Systems  All other systems reviewed and are negative.      Objective:   Physical Exam  Constitutional: She is oriented to person, place, and time. She appears well-developed and well-nourished.  HENT:  Head: Normocephalic and atraumatic.  Right Ear: External ear normal.  Left Ear: External ear normal.  Right TM erythematous. Good light reflex.  Tenderness over frontal and maxillary sinuses.  Nasal turbinates red and swollen.  oropharyx erythematous without tonsil swollen or exudate.   Eyes: Conjunctivae are normal. Right eye exhibits no discharge. Left eye exhibits no discharge.  Neck: Normal range of motion. Neck  supple.  Cardiovascular: Normal rate, regular rhythm and normal heart sounds.  Pulmonary/Chest: Effort normal and breath sounds normal. She has no wheezes.  Lymphadenopathy:    She has no cervical adenopathy.  Neurological: She is alert and oriented to person, place, and time.  Psychiatric: She has a normal mood and affect. Her behavior is normal.          Assessment & Plan:  Marland Kitchen.Marland Kitchen.Mariah Moore was seen today for follow-up and short of breath going upstairs.  Diagnoses and all orders for this visit:  Acute non-recurrent maxillary sinusitis -     azithromycin (ZITHROMAX) 250 MG tablet; Take 2 tablets now and then one tablet for 4 days.  Patient is not completely finished her antibiotic.  Encouraged her to do so.  I encouraged her to keep to continue symptomatic care with humidifier and consider Vicks vapor solution to add additional benefit with a humidifier.  I would like for her to go ahead and pick up the Atrovent and use 4 times a day each nostril.  She can continue with Mucinex.  If not improving in the next 3-4 days and having significant sinus pressure and drainage go ahead and start azithromycin.  I printed this out to only start if needed.  She continue using all the other symptomatic care discussed at previous visit.  If she is still having significant sinus related symptoms we will need to consider CT of sinuses.  Follow-up as needed.

## 2017-12-21 NOTE — Patient Instructions (Signed)
atrovent 4 times a day nasal spray.    Sinusitis, Adult Sinusitis is soreness and inflammation of your sinuses. Sinuses are hollow spaces in the bones around your face. They are located:  Around your eyes.  In the middle of your forehead.  Behind your nose.  In your cheekbones.  Your sinuses and nasal passages are lined with a stringy fluid (mucus). Mucus normally drains out of your sinuses. When your nasal tissues get inflamed or swollen, the mucus can get trapped or blocked so air cannot flow through your sinuses. This lets bacteria, viruses, and funguses grow, and that leads to infection. Follow these instructions at home: Medicines  Take, use, or apply over-the-counter and prescription medicines only as told by your doctor. These may include nasal sprays.  If you were prescribed an antibiotic medicine, take it as told by your doctor. Do not stop taking the antibiotic even if you start to feel better. Hydrate and Humidify  Drink enough water to keep your pee (urine) clear or pale yellow.  Use a cool mist humidifier to keep the humidity level in your home above 50%.  Breathe in steam for 10-15 minutes, 3-4 times a day or as told by your doctor. You can do this in the bathroom while a hot shower is running.  Try not to spend time in cool or dry air. Rest  Rest as much as possible.  Sleep with your head raised (elevated).  Make sure to get enough sleep each night. General instructions  Put a warm, moist washcloth on your face 3-4 times a day or as told by your doctor. This will help with discomfort.  Wash your hands often with soap and water. If there is no soap and water, use hand sanitizer.  Do not smoke. Avoid being around people who are smoking (secondhand smoke).  Keep all follow-up visits as told by your doctor. This is important. Contact a doctor if:  You have a fever.  Your symptoms get worse.  Your symptoms do not get better within 10 days. Get help right  away if:  You have a very bad headache.  You cannot stop throwing up (vomiting).  You have pain or swelling around your face or eyes.  You have trouble seeing.  You feel confused.  Your neck is stiff.  You have trouble breathing. This information is not intended to replace advice given to you by your health care provider. Make sure you discuss any questions you have with your health care provider. Document Released: 05/31/2008 Document Revised: 08/08/2016 Document Reviewed: 10/08/2015 Elsevier Interactive Patient Education  [employer]2018 Elsevier Inc.

## 2017-12-25 ENCOUNTER — Encounter: Payer: Self-pay | Admitting: Physician Assistant

## 2018-03-22 ENCOUNTER — Ambulatory Visit: Payer: BLUE CROSS/BLUE SHIELD | Admitting: Physician Assistant

## 2018-05-29 ENCOUNTER — Other Ambulatory Visit: Payer: Self-pay | Admitting: Physician Assistant

## 2018-05-29 DIAGNOSIS — Z1231 Encounter for screening mammogram for malignant neoplasm of breast: Secondary | ICD-10-CM

## 2018-05-31 ENCOUNTER — Ambulatory Visit (INDEPENDENT_AMBULATORY_CARE_PROVIDER_SITE_OTHER): Payer: BLUE CROSS/BLUE SHIELD

## 2018-05-31 ENCOUNTER — Encounter: Payer: Self-pay | Admitting: Physician Assistant

## 2018-05-31 DIAGNOSIS — Z1231 Encounter for screening mammogram for malignant neoplasm of breast: Secondary | ICD-10-CM | POA: Diagnosis not present

## 2018-05-31 DIAGNOSIS — R928 Other abnormal and inconclusive findings on diagnostic imaging of breast: Secondary | ICD-10-CM | POA: Insufficient documentation

## 2018-06-01 ENCOUNTER — Other Ambulatory Visit: Payer: Self-pay | Admitting: Physician Assistant

## 2018-06-01 DIAGNOSIS — R928 Other abnormal and inconclusive findings on diagnostic imaging of breast: Secondary | ICD-10-CM

## 2018-06-07 ENCOUNTER — Ambulatory Visit: Payer: BLUE CROSS/BLUE SHIELD

## 2018-06-07 ENCOUNTER — Ambulatory Visit
Admission: RE | Admit: 2018-06-07 | Discharge: 2018-06-07 | Disposition: A | Discharge status: Home / Self Care | Payer: BLUE CROSS/BLUE SHIELD | Source: Ambulatory Visit | Attending: Physician Assistant | Admitting: Physician Assistant

## 2018-06-07 DIAGNOSIS — R928 Other abnormal and inconclusive findings on diagnostic imaging of breast: Secondary | ICD-10-CM

## 2018-06-07 NOTE — Progress Notes (Signed)
Call pt: mammogram normal. Follow up in one year.

## 2018-09-23 ENCOUNTER — Telehealth: Payer: BLUE CROSS/BLUE SHIELD | Admitting: Physician Assistant

## 2018-09-23 DIAGNOSIS — N390 Urinary tract infection, site not specified: Secondary | ICD-10-CM

## 2018-09-23 DIAGNOSIS — R300 Dysuria: Secondary | ICD-10-CM

## 2018-09-23 MED ORDER — CEPHALEXIN 250 MG/5ML PO SUSR: 500.0000 mg | Freq: Two times a day (BID) | ORAL | 0 refills | Status: DC

## 2018-09-23 NOTE — Progress Notes (Signed)

## 2018-09-24 MED ORDER — CIPROFLOXACIN 250 MG/5ML (5%) PO SUSR: 500.0000 mg | Freq: Two times a day (BID) | ORAL | 0 refills | Status: DC

## 2018-09-24 NOTE — Progress Notes (Signed)
Changed med due to pt possible adverse reaction.  "Mariah Moore,  It is not normal at all and this may be a coincidence and not related. However, sometimes when someone is having an adverse reaction to a medication, it manifests in non-traditional ways. That being said, let's play it safe and switch to Cipro. I prescribed the liquid form as you requested and the pharmacy should be calling you soon. Cipro 500mg  by mouth twice daily for 5 days (liquid).  Best,  Constance Haw, DNP, FNP-BC [place] E-Visit Team"

## 2018-09-24 NOTE — Addendum Note (Signed)
Addended by: Constance Haw C on: 09/24/2018 10:00 AM   Modules accepted: Orders

## 2018-10-05 DIAGNOSIS — Z23 Encounter for immunization: Secondary | ICD-10-CM | POA: Diagnosis not present

## 2019-04-03 ENCOUNTER — Telehealth: Payer: BLUE CROSS/BLUE SHIELD | Admitting: Physician Assistant

## 2019-04-03 DIAGNOSIS — B9689 Other specified bacterial agents as the cause of diseases classified elsewhere: Secondary | ICD-10-CM | POA: Diagnosis not present

## 2019-04-03 DIAGNOSIS — J019 Acute sinusitis, unspecified: Secondary | ICD-10-CM

## 2019-04-03 MED ORDER — AMOXICILLIN-POT CLAVULANATE 875-125 MG PO TABS: 1.0000 | ORAL_TABLET | Freq: Two times a day (BID) | ORAL | 0 refills | Status: AC

## 2019-04-03 NOTE — Progress Notes (Signed)
We are sorry that you are not feeling well.  Here is how we plan to help!  Based on what you have shared with me it looks like you have sinusitis.  Sinusitis is inflammation and infection in the sinus cavities of the head.  Based on your presentation I believe you most likely have Acute Bacterial Sinusitis.  This is an infection caused by bacteria and is treated with antibiotics. I have prescribed Augmentin 875mg /125mg  one tablet twice daily with food, for 7 days. You may use an oral decongestant such as Mucinex D or if you have glaucoma or high blood pressure use plain Mucinex. Saline nasal spray help and can safely be used as often as needed for congestion.  If you develop worsening sinus pain, fever or notice severe headache and vision changes, or if symptoms are not better after completion of antibiotic, please schedule an appointment with a health care provider.    Sinus infections are not as easily transmitted as other respiratory infection, however we still recommend that you avoid close contact with loved ones, especially the very young and elderly.  Remember to wash your hands thoroughly throughout the day as this is the number one way to prevent the spread of infection!  Home Care: Only take medications as instructed by your medical team. Complete the entire course of an antibiotic. Do not take these medications with alcohol. A steam or ultrasonic humidifier can help congestion.  You can place a towel over your head and breathe in the steam from hot water coming from a faucet. Avoid close contacts especially the very young and the elderly. Cover your mouth when you cough or sneeze. Always remember to wash your hands.  Get Help Right Away If: You develop worsening fever or sinus pain. You develop a severe head ache or visual changes. Your symptoms persist after you have completed your treatment plan.  Make sure you Understand these instructions. Will watch your condition. Will get  help right away if you are not doing well or get worse.  Your e-visit answers were reviewed by a board certified advanced clinical practitioner to complete your personal care plan.  Depending on the condition, your plan could have included both over the counter or prescription medications.  If there is a problem please reply once you have received a response from your provider.  Your safety is important to [place].  If you have drug allergies check your prescription carefully.    You can use MyChart to ask questions about today's visit, request a non-urgent call back, or ask for a work or school excuse for 24 hours related to this e-Visit. If it has been greater than 24 hours you will need to follow up with your provider, or enter a new e-Visit to address those concerns.  You will get an e-mail in the next two days asking about your experience.  I hope that your e-visit has been valuable and will speed your recovery. Thank you for using e-visits.    ===View-only below this line===   ----- Message -----    From: Mariah Moore    Sent: 04/03/2019  8:20 AM EDT      To: E-Visit Mailing List Subject: E-Visit Submission: Sinus Problems  E-Visit Submission: Sinus Problems --------------------------------  Question: Which of the following have you been experiencing? Answer:   Pain around the nose and face            Headache  Ear pain  Question: Have these symptoms significantly worsened over the last two to three days? Answer:   Yes  Question: Have you had any of the following? Answer:   None of the above  Question: How long have you been having these symptoms? Answer:   7 or more days  Question: Do you have a fever? Answer:   No, I do not have a fever  Question: Do you smoke? Answer:   No  Question: Have you ever smoked? Answer:   I have never smoked  Question: Do you have any chronic illnesses, such as diabetes, heart disease, kidney disease, or lung disease, or any  illness that would weaken your body's ability to fight infection? Answer:   No  Question: When you blow your nose, what color is the mucus? Answer:   Mostly thin and yellow or green  Question: Have you experienced similar problems in the past? Answer:   Yes  Question: What treatments have worked in the past?  Answer:   Augmentin or amoxicillin worked great in the past.  Question: What treatment(s) in the past have been unsuccessful? Answer:   I can't take Keflex.  It gives me a horrible headache.  Doctor thought it might be a allergic reaction to the antibiotic.  Question: Is this illness similar to previous illnesses you have had?  How is it the same?  How is it different? Answer:   Yes, It felt like this when I had a sinus infection in the past.  Question: Have you recently been hospitalized? Answer:   No  Question: What medications are you currently taking for these symptoms? Answer:   Decongestants            Pain medicine            Nose spray  Question: Please enter the names of any medications you are taking, or any other treatments you are trying. Answer:   I switch between Dayquil and Sudafed,  also taking Flonase nasal mist, and zyrtec. Tylenol with the sudafed.  Question: Are you pregnant? Answer:   I am confident that I am not pregnant  Question: Are you breastfeeding? Answer:   No  Question: Please list your medication allergies that you may have ? (If 'none' , please list as 'none') Answer:   Medicine containing Codeine makes me throw up.  Question: Please list any additional comments  Answer:     A total of 5-10 minutes was spent evaluating this patients questionnaire and formulating a plan of care.

## 2019-08-02 ENCOUNTER — Encounter: Payer: Self-pay | Admitting: Physician Assistant

## 2019-08-03 ENCOUNTER — Encounter: Payer: Self-pay | Admitting: Physician Assistant

## 2019-08-03 ENCOUNTER — Ambulatory Visit (INDEPENDENT_AMBULATORY_CARE_PROVIDER_SITE_OTHER): Payer: BC Managed Care – PPO | Admitting: Physician Assistant

## 2019-08-03 ENCOUNTER — Ambulatory Visit: Payer: BLUE CROSS/BLUE SHIELD | Admitting: Osteopathic Medicine

## 2019-08-03 ENCOUNTER — Other Ambulatory Visit: Payer: Self-pay

## 2019-08-03 VITALS — BP 145/84 | HR 91 | Temp 98.5°F | Ht 65.0 in | Wt 200.0 lb

## 2019-08-03 DIAGNOSIS — K1120 Sialoadenitis, unspecified: Secondary | ICD-10-CM | POA: Diagnosis not present

## 2019-08-03 DIAGNOSIS — N926 Irregular menstruation, unspecified: Secondary | ICD-10-CM

## 2019-08-03 MED ORDER — AMOXICILLIN-POT CLAVULANATE 875-125 MG PO TABS: 1.0000 | ORAL_TABLET | Freq: Two times a day (BID) | ORAL | 0 refills | Status: DC

## 2019-08-03 MED ORDER — IBUPROFEN 800 MG PO TABS: 800.0000 mg | ORAL_TABLET | Freq: Three times a day (TID) | ORAL | 0 refills | Status: DC | PRN

## 2019-08-03 MED ORDER — PREDNISONE 20 MG PO TABS: ORAL_TABLET | ORAL | 0 refills | Status: DC

## 2019-08-03 NOTE — Patient Instructions (Signed)
Parotitis  Parotitis means that you have irritation and swelling (inflammation) in one or both of your parotid glands. These glands make saliva. They are found on each side of your face, below and in front of your earlobes. You may or may not have pain with this condition. What are the causes? This condition may be caused by:  Infections from germs (bacteria or viruses).  Something blocking the flow of saliva through the parotid glands. This can be a stone, scar tissue, or a tumor.  Diseases that cause your body's defense system (immune system) to attack healthy cells in your salivary glands. These are called autoimmune diseases. What increases the risk? You are more likely to get this condition if:  You are 45 years old or older.  You do not drink enough fluids (are dehydrated).  You drink too much alcohol.  You have: ? A dry mouth. ? Diabetes. ? Gout. ? A long-term illness.  You do not take good care of your mouth and teeth (poor dental hygiene).  You have had radiation treatments to the head and neck.  You take certain medicines. What are the signs or symptoms? Symptoms of this condition depend on the cause. They may include:  Swelling under and in front of the ear. This may get worse after you eat.  Redness of the skin over the parotid gland.  Pain and tenderness over the parotid gland. This may get worse after you eat.  Fever or chills.  Pus coming from the ducts inside the mouth.  Dry mouth.  A bad taste in the mouth. How is this treated? Treatment for this condition depends on the cause. Treatment may include:  Antibiotic medicine for an infection from bacteria.  Drinking more fluids.  Removing a stone or obstruction.  Treating a disease that is causing parotitis.  Surgery to drain an infection, remove a growth, or remove the whole gland. Treatment may not be needed if the swelling goes away with home care. Follow these instructions at home:  Medicines   Take over-the-counter and prescription medicines only as told by your doctor.  If you were prescribed an antibiotic medicine, take it as told by your doctor. Do not stop taking the antibiotic even if you start to feel better. Managing pain and swelling  If told, put heat on the affected area. Do this as often as told by your doctor. Use the heat source that your doctor recommends, such as a moist heat pack or a heating pad. ? Place a towel between your skin and the heat source. ? Leave the heat on for 20-30 minutes. ? Remove the heat if your skin turns bright red. This is very important if you are unable to feel pain, heat, or cold. You may have a greater risk of getting burned.  Gargle with salt water 3-4 times a day or as needed. To make salt water, dissolve -1 tsp (3-6 g) of salt in 1 cup (237 mL) of warm water.  Gently rub your parotid glands as told by your doctor. General instructions   Drink enough fluid to keep your pee (urine) pale yellow.  Keep your mouth clean and moist.  Suck on sour candy. This may help to: ? Make your mouth less dry. ? Make more saliva.  Take good care of your mouth: ? Brush your teeth at least two times a day. ? Floss your teeth every day. ? See your dentist regularly.  Do not use any products that contain nicotine or  tobacco. These include cigarettes, e-cigarettes, and chewing tobacco. If you need help quitting, ask your doctor.  Do not drink alcohol.  Keep all follow-up visits as told by your doctor. This is important. Contact a doctor if:  You have a fever or chills.  You have new symptoms.  Your symptoms get worse.  Your symptoms do not get better with treatment. Get help right away if:  You have trouble breathing or swallowing. Summary  Parotitis means that you have irritation and swelling (inflammation) in one or both of your parotid glands.  Symptoms include pain and swelling under and in front of the ear.   Treatment for parotitis depends on the cause. In some cases, the condition may go away on its own with home care.  You should drink plenty of fluids, take good care of your mouth, and avoid tobacco products. This information is not intended to replace advice given to you by your health care provider. Make sure you discuss any questions you have with your health care provider. Document Released: 01/15/2011 Document Revised: 07/11/2018 Document Reviewed: 07/11/2018 Elsevier Patient Education  [employer].

## 2019-08-03 NOTE — Progress Notes (Signed)
Subjective:    Patient ID: Mariah Moore, female    DOB: 01/13/1971, 48 y.o.   MRN: [id number]  HPI  Pt is a 48 yo female who presents to the clinic with right jaw and ear pain and swelling for last 4 days. She thought at first it was her teeth but doesn't hurt to chew and no pain when she pushes on anything in her mouth. No fever, chills, rash, SOB, sinus pressure, URI symptoms. She has all her childhood vaccines per patient. No problems swallowing. No other sick contacts. She had taken tylenol/ibuprofen and decongestants with no benefit. She has never had anything like this before.    Pt mentioned she has not had a period for 6 months. She would like to know if she is in menopause.   .. Active Ambulatory Problems    Diagnosis Date Noted  . Obesity, unspecified 05/29/2014  . Miscarriage within last 12 months 05/29/2014  . Problems with swallowing 07/28/2016  . Thyromegaly 07/28/2016  . Esophageal reflux 07/28/2016  . Missed period 07/28/2016  . Early menopause occurring in patient age younger than 5 years 07/30/2016  . Multiple thyroid nodules 08/03/2016  . Chest pain 08/16/2016  . Abnormality of left breast on screening mammogram 05/31/2018   Resolved Ambulatory Problems    Diagnosis Date Noted  . No Resolved Ambulatory Problems   Past Medical History:  Diagnosis Date  . Allergy       Review of Systems See HPI.     Objective:   Physical Exam Vitals signs reviewed.  Constitutional:      Appearance: Normal appearance.  HENT:     Head: Normocephalic.     Right Ear: Tympanic membrane, ear canal and external ear normal.     Left Ear: Tympanic membrane, ear canal and external ear normal.     Nose: Nose normal.     Mouth/Throat:     Mouth: Mucous membranes are moist.  Eyes:     Extraocular Movements: Extraocular movements intact.     Pupils: Pupils are equal, round, and reactive to light.  Neck:     Musculoskeletal: Normal range of motion.     Comments: Swollen  right parotid gland warm and tender to touch  Cardiovascular:     Rate and Rhythm: Normal rate and regular rhythm.     Pulses: Normal pulses.     Heart sounds: Normal heart sounds.  Pulmonary:     Effort: Pulmonary effort is normal.     Breath sounds: Normal breath sounds.  Musculoskeletal: Normal range of motion.  Lymphadenopathy:     Cervical: No cervical adenopathy.  Neurological:     General: No focal deficit present.     Mental Status: She is alert and oriented to person, place, and time.  Psychiatric:        Mood and Affect: Mood normal.        Behavior: Behavior normal.           Assessment & Plan:  Marland KitchenMarland KitchenTimea was seen today for jaw pain.  Diagnoses and all orders for this visit:  Parotiditis -     Mumps Antibody, IgM -     amoxicillin-clavulanate (AUGMENTIN) 875-125 MG tablet; Take 1 tablet by mouth 2 (two) times daily. -     CBC with Differential/Platelet -     predniSONE (DELTASONE) 20 MG tablet; Take one tablet twice a day for 5 days. -     ibuprofen (ADVIL) 800 MG tablet; Take 1 tablet (800 mg  total) by mouth every 8 (eight) hours as needed.  Missed period -     FSH/LH   Will check CBC and mumps. Pt reports to have childhood vaccines. Unknown cause bacterial vs viral vs salivary stone. Will give augmentina dnd ibuprofen start for next 2-3 days if no improvement can start prednisone. Use warm compresses. Suck on sour candy to help push any stone through. Follow up as needed.

## 2019-08-04 ENCOUNTER — Encounter: Payer: Self-pay | Admitting: Physician Assistant

## 2019-08-04 LAB — FSH/LH
FSH: 69.8 m[IU]/mL
LH: 37.9 m[IU]/mL

## 2019-08-04 NOTE — Progress Notes (Signed)
Call pt: you are in menopause. FSH is elevated. WBC is normal. How are you feeling?

## 2019-08-05 ENCOUNTER — Encounter: Payer: Self-pay | Admitting: Physician Assistant

## 2019-08-06 ENCOUNTER — Encounter: Payer: Self-pay | Admitting: Physician Assistant

## 2019-08-07 LAB — CBC WITH DIFFERENTIAL/PLATELET
Absolute Monocytes: 451 cells/uL (ref 200–950)
Basophils Absolute: 37 cells/uL (ref 0–200)
Basophils Relative: 0.5 %
Eosinophils Absolute: 104 cells/uL (ref 15–500)
Eosinophils Relative: 1.4 %
HCT: 40.3 % (ref 35.0–45.0)
Hemoglobin: 13.3 g/dL (ref 11.7–15.5)
Lymphs Abs: 1140 cells/uL (ref 850–3900)
MCH: 28.1 pg (ref 27.0–33.0)
MCHC: 33 g/dL (ref 32.0–36.0)
MCV: 85.2 fL (ref 80.0–100.0)
MPV: 10.5 fL (ref 7.5–12.5)
Monocytes Relative: 6.1 %
Neutro Abs: 5668 cells/uL (ref 1500–7800)
Neutrophils Relative %: 76.6 %
Platelets: 264 10*3/uL (ref 140–400)
RBC: 4.73 10*6/uL (ref 3.80–5.10)
RDW: 13 % (ref 11.0–15.0)
Total Lymphocyte: 15.4 %
WBC: 7.4 10*3/uL (ref 3.8–10.8)

## 2019-08-07 LAB — MUMPS ANTIBODY, IGM: Mumps IgM Value: <1:20 {titer}

## 2019-08-08 NOTE — Progress Notes (Signed)
Call pt: mumps negative. How do you feel today? Is the swelling better?

## 2019-08-09 ENCOUNTER — Telehealth: Payer: Self-pay | Admitting: Physician Assistant

## 2019-08-09 NOTE — Telephone Encounter (Signed)
Patient has already been seen for this. Completed, please do not send again as this was taken care of.

## 2019-10-11 DIAGNOSIS — Z23 Encounter for immunization: Secondary | ICD-10-CM | POA: Diagnosis not present

## 2020-03-24 ENCOUNTER — Other Ambulatory Visit: Payer: Self-pay | Admitting: Physician Assistant

## 2020-03-24 DIAGNOSIS — Z1231 Encounter for screening mammogram for malignant neoplasm of breast: Secondary | ICD-10-CM

## 2020-04-02 ENCOUNTER — Ambulatory Visit (INDEPENDENT_AMBULATORY_CARE_PROVIDER_SITE_OTHER): Payer: BC Managed Care – PPO

## 2020-04-02 ENCOUNTER — Other Ambulatory Visit: Payer: Self-pay

## 2020-04-02 DIAGNOSIS — Z1231 Encounter for screening mammogram for malignant neoplasm of breast: Secondary | ICD-10-CM

## 2020-04-02 NOTE — Progress Notes (Signed)
Normal mammogram. Follow up in 1 year.

## 2020-04-09 ENCOUNTER — Encounter: Payer: Self-pay | Admitting: Physician Assistant

## 2020-04-09 NOTE — Telephone Encounter (Signed)
Routing to provider. Covid vaccine updated in patient's chart.  

## 2020-10-31 DIAGNOSIS — Z23 Encounter for immunization: Secondary | ICD-10-CM | POA: Diagnosis not present

## 2020-11-27 ENCOUNTER — Telehealth: Payer: BC Managed Care – PPO | Admitting: Family

## 2020-11-27 DIAGNOSIS — K1120 Sialoadenitis, unspecified: Secondary | ICD-10-CM

## 2020-11-27 DIAGNOSIS — J019 Acute sinusitis, unspecified: Secondary | ICD-10-CM

## 2020-11-27 MED ORDER — AMOXICILLIN-POT CLAVULANATE 875-125 MG PO TABS: 1.0000 | ORAL_TABLET | Freq: Two times a day (BID) | ORAL | 0 refills | Status: DC

## 2020-11-27 NOTE — Progress Notes (Signed)
We are sorry that you are not feeling well.  Here is how we plan to help!  Based on what you have shared with me it looks like you have sinusitis.  Sinusitis is inflammation and infection in the sinus cavities of the head.  Based on your presentation I believe you most likely have Acute Bacterial Sinusitis.  This is an infection caused by bacteria and is treated with antibiotics. I have prescribed Augmentin 875mg /125mg  one tablet twice daily with food, for 10 days. You may use an oral decongestant such as Mucinex D or if you have glaucoma or high blood pressure use plain Mucinex. Saline nasal spray help and can safely be used as often as needed for congestion.  If you develop worsening sinus pain, fever or notice severe headache and vision changes, or if symptoms are not better after completion of antibiotic, please schedule an appointment with a health care provider.    You have used this antibiotic in the past and it is able to be crushed as you requested. Please let me know if this did not work for you in the past though.   Sinus infections are not as easily transmitted as other respiratory infection, however we still recommend that you avoid close contact with loved ones, especially the very young and elderly.  Remember to wash your hands thoroughly throughout the day as this is the number one way to prevent the spread of infection!  Home Care:  Only take medications as instructed by your medical team.  Complete the entire course of an antibiotic.  Do not take these medications with alcohol.  A steam or ultrasonic humidifier can help congestion.  You can place a towel over your head and breathe in the steam from hot water coming from a faucet.  Avoid close contacts especially the very young and the elderly.  Cover your mouth when you cough or sneeze.  Always remember to wash your hands.  Get Help Right Away If:  You develop worsening fever or sinus pain.  You develop a severe head  ache or visual changes.  Your symptoms persist after you have completed your treatment plan.  Make sure you  Understand these instructions.  Will watch your condition.  Will get help right away if you are not doing well or get worse.  Your e-visit answers were reviewed by a board certified advanced clinical practitioner to complete your personal care plan.  Depending on the condition, your plan could have included both over the counter or prescription medications.  If there is a problem please reply  once you have received a response from your provider.  Your safety is important to .  If you have drug allergies check your prescription carefully.    You can use MyChart to ask questions about today's visit, request a non-urgent call back, or ask for a work or school excuse for 24 hours related to this e-Visit. If it has been greater than 24 hours you will need to follow up with your provider, or enter a new e-Visit to address those concerns.  You will get an e-mail in the next two days asking about your experience.  I hope that your e-visit has been valuable and will speed your recovery. Thank you for using e-visits.  Greater than 5 minutes, yet less than 10 minutes of time have been spent researching, coordinating, and implementing care for this patient.

## 2021-07-06 ENCOUNTER — Other Ambulatory Visit: Payer: Self-pay | Admitting: Physician Assistant

## 2021-07-06 DIAGNOSIS — Z1231 Encounter for screening mammogram for malignant neoplasm of breast: Secondary | ICD-10-CM

## 2021-07-08 ENCOUNTER — Other Ambulatory Visit: Payer: Self-pay

## 2021-07-08 ENCOUNTER — Ambulatory Visit (INDEPENDENT_AMBULATORY_CARE_PROVIDER_SITE_OTHER): Payer: BC Managed Care – PPO

## 2021-07-08 DIAGNOSIS — Z1231 Encounter for screening mammogram for malignant neoplasm of breast: Secondary | ICD-10-CM | POA: Diagnosis not present

## 2021-07-11 NOTE — Progress Notes (Signed)
Normal mammogram. Follow up in 1 year.

## 2021-10-30 DIAGNOSIS — Z23 Encounter for immunization: Secondary | ICD-10-CM | POA: Diagnosis not present

## 2022-07-12 ENCOUNTER — Other Ambulatory Visit: Payer: Self-pay | Admitting: Physician Assistant

## 2022-07-12 DIAGNOSIS — Z1231 Encounter for screening mammogram for malignant neoplasm of breast: Secondary | ICD-10-CM

## 2022-07-14 ENCOUNTER — Ambulatory Visit (INDEPENDENT_AMBULATORY_CARE_PROVIDER_SITE_OTHER): Payer: BC Managed Care – PPO

## 2022-07-14 DIAGNOSIS — Z1231 Encounter for screening mammogram for malignant neoplasm of breast: Secondary | ICD-10-CM | POA: Diagnosis not present

## 2022-07-16 NOTE — Progress Notes (Signed)
Normal mammogram. Follow up in 1 year.

## 2022-08-09 ENCOUNTER — Ambulatory Visit (INDEPENDENT_AMBULATORY_CARE_PROVIDER_SITE_OTHER): Payer: BC Managed Care – PPO | Admitting: Physician Assistant

## 2022-08-09 ENCOUNTER — Encounter: Payer: Self-pay | Admitting: Physician Assistant

## 2022-08-09 ENCOUNTER — Other Ambulatory Visit (HOSPITAL_COMMUNITY)
Admission: RE | Admit: 2022-08-09 | Discharge: 2022-08-09 | Disposition: A | Discharge status: Home / Self Care | Payer: BC Managed Care – PPO | Source: Ambulatory Visit | Attending: Physician Assistant | Admitting: Physician Assistant

## 2022-08-09 VITALS — BP 118/67 | HR 88 | Ht 65.0 in | Wt 194.0 lb

## 2022-08-09 DIAGNOSIS — Z23 Encounter for immunization: Secondary | ICD-10-CM

## 2022-08-09 DIAGNOSIS — Z0000 Encounter for general adult medical examination without abnormal findings: Secondary | ICD-10-CM

## 2022-08-09 DIAGNOSIS — E010 Iodine-deficiency related diffuse (endemic) goiter: Secondary | ICD-10-CM | POA: Diagnosis not present

## 2022-08-09 DIAGNOSIS — Z124 Encounter for screening for malignant neoplasm of cervix: Secondary | ICD-10-CM

## 2022-08-09 DIAGNOSIS — Z13220 Encounter for screening for lipoid disorders: Secondary | ICD-10-CM | POA: Diagnosis not present

## 2022-08-09 DIAGNOSIS — Z1211 Encounter for screening for malignant neoplasm of colon: Secondary | ICD-10-CM

## 2022-08-09 DIAGNOSIS — Z131 Encounter for screening for diabetes mellitus: Secondary | ICD-10-CM | POA: Diagnosis not present

## 2022-08-09 DIAGNOSIS — Z6832 Body mass index (BMI) 32.0-32.9, adult: Secondary | ICD-10-CM

## 2022-08-09 DIAGNOSIS — E6609 Other obesity due to excess calories: Secondary | ICD-10-CM

## 2022-08-09 NOTE — Progress Notes (Unsigned)
Complete physical exam  Patient: Jahmiyah Dullea   DOB: 1971-12-09   51 y.o. Female  MRN: [id number]  Subjective:    Chief Complaint  Patient presents with   Annual Exam    Rukaya Kleinschmidt is a 51 y.o. female who presents today for a complete physical exam. She reports consuming a {diet types:17450} diet. {types:19826} She generally feels {DESC; WELL/FAIRLY WELL/POORLY:18703}. She reports sleeping {DESC; WELL/FAIRLY WELL/POORLY:18703}. She {does/does not:200015} have additional problems to discuss today.   colonoscopy  Most recent fall risk assessment:    08/09/2022    2:20 PM  Fall Risk   Falls in the past year? 0  Number falls in past yr: 0  Injury with Fall? 0  Risk for fall due to : No Fall Risks  Follow up Falls evaluation completed     Most recent depression screenings:    08/09/2022    2:20 PM  PHQ 2/9 Scores  PHQ - 2 Score 0    {VISON DENTAL STD PSA (Optional):27386}  {History (Optional):23778}  Patient Care Team: Nolene Ebbs as PCP - General (Family Medicine)   Outpatient Medications Prior to Visit  Medication Sig   cetirizine (ZYRTEC) 10 MG tablet Take 10 mg by mouth daily.   ipratropium (ATROVENT) 0.06 % nasal spray Place 2 sprays into both nostrils every 4 (four) hours as needed for rhinitis.   Multiple Vitamin (MULTIVITAMIN) capsule Take 1 capsule by mouth daily.   [DISCONTINUED] amoxicillin-clavulanate (AUGMENTIN) 875-125 MG tablet Take 1 tablet by mouth 2 (two) times daily.   [DISCONTINUED] ibuprofen (ADVIL) 800 MG tablet Take 1 tablet (800 mg total) by mouth every 8 (eight) hours as needed.   [DISCONTINUED] predniSONE (DELTASONE) 20 MG tablet Take one tablet twice a day for 5 days.   No facility-administered medications prior to visit.    ROS        Objective:     BP 118/67   Pulse 88   Ht 5\' 5"  (1.651 m)   Wt 194 lb (88 kg)   SpO2 100%   BMI 32.28 kg/m  {Vitals History (Optional):23777}  Physical Exam   No results found  for any visits on 08/09/22. {Show previous labs (optional):23779}    Assessment & Plan:    Routine Health Maintenance and Physical Exam  Immunization History  Administered Date(s) Administered   Influenza Whole 07/27/2010   Influenza,inj,Quad PF,6+ Mos 09/26/2013, 10/30/2021   Influenza,inj,Quad PF,6-35 Mos 10/31/2020   Influenza-Unspecified 12/02/2016, 10/10/2017, 10/05/2018, 10/11/2019   PFIZER(Purple Top)SARS-COV-2 Vaccination 04/03/2020, 12/22/2020   Pneumococcal Polysaccharide-23 10/27/2010   Tdap 10/27/2010    Health Maintenance  Topic Date Due   HIV Screening  Never done   Hepatitis C Screening  Never done   PAP SMEAR-Modifier  05/29/2017   COLONOSCOPY (Pts 45-62yrs Insurance coverage will need to be confirmed)  Never done   TETANUS/TDAP  10/27/2020   INFLUENZA VACCINE  07/27/2022   COVID-19 Vaccine (3 - Pfizer series) 08/25/2022 (Originally 02/16/2021)   MAMMOGRAM  07/15/2023   HPV VACCINES  Aged Out    Discussed health benefits of physical activity, and encouraged her to engage in regular exercise appropriate for her age and condition.  Problem List Items Addressed This Visit       Unprioritized   Thyromegaly - Primary   Other Visit Diagnoses     Lipid screening       Diabetes mellitus screening       Routine physical examination       Colon cancer  screening       Papanicolaou smear       Need for Tdap vaccination       Relevant Orders   Tdap vaccine greater than or equal to 7yo IM      No follow-ups on file.     Tandy Gaw, PA-C

## 2022-08-09 NOTE — Patient Instructions (Signed)

## 2022-08-10 LAB — CYTOLOGY - PAP
Adequacy: ABSENT
Comment: NEGATIVE
Diagnosis: NEGATIVE
High risk HPV: NEGATIVE

## 2022-08-10 NOTE — Progress Notes (Signed)
Negative HPV. No abnormal cells. Next pap 3 years.

## 2022-08-12 DIAGNOSIS — Z13220 Encounter for screening for lipoid disorders: Secondary | ICD-10-CM | POA: Diagnosis not present

## 2022-08-12 DIAGNOSIS — Z1211 Encounter for screening for malignant neoplasm of colon: Secondary | ICD-10-CM | POA: Diagnosis not present

## 2022-08-12 DIAGNOSIS — E010 Iodine-deficiency related diffuse (endemic) goiter: Secondary | ICD-10-CM | POA: Diagnosis not present

## 2022-08-12 DIAGNOSIS — Z131 Encounter for screening for diabetes mellitus: Secondary | ICD-10-CM | POA: Diagnosis not present

## 2022-08-12 DIAGNOSIS — Z0000 Encounter for general adult medical examination without abnormal findings: Secondary | ICD-10-CM | POA: Diagnosis not present

## 2022-08-13 ENCOUNTER — Encounter: Payer: Self-pay | Admitting: Physician Assistant

## 2022-08-13 DIAGNOSIS — R7301 Impaired fasting glucose: Secondary | ICD-10-CM | POA: Insufficient documentation

## 2022-08-13 NOTE — Progress Notes (Signed)
Mariah Moore,   Hemoglobin looks great. Thyroid looks great. Kidney and liver looks good.  Your fasting sugar is elevated. Will add A1C for further testing/evaluation for diabetes.  HDL looks good.  LDL close to optimal level and good.  Your TG are a little elevated. Watch sugars and carbs.

## 2022-08-14 LAB — LIPID PANEL W/REFLEX DIRECT LDL
Cholesterol: 184 mg/dL (ref <200)
HDL: 57 mg/dL (ref >=50)
LDL Cholesterol (Calc): 101 mg/dL (calc) — ABNORMAL HIGH
Non-HDL Cholesterol (Calc): 127 mg/dL (calc) (ref <130)
Total CHOL/HDL Ratio: 3.2 (calc) (ref <5.0)
Triglycerides: 163 mg/dL — ABNORMAL HIGH (ref <150)

## 2022-08-14 LAB — CBC WITH DIFFERENTIAL/PLATELET
Absolute Monocytes: 410 cells/uL (ref 200–950)
Basophils Absolute: 41 cells/uL (ref 0–200)
Basophils Relative: 0.9 %
Eosinophils Absolute: 99 cells/uL (ref 15–500)
Eosinophils Relative: 2.2 %
HCT: 43 % (ref 35.0–45.0)
Hemoglobin: 14.3 g/dL (ref 11.7–15.5)
Lymphs Abs: 1445 cells/uL (ref 850–3900)
MCH: 28.3 pg (ref 27.0–33.0)
MCHC: 33.3 g/dL (ref 32.0–36.0)
MCV: 85.1 fL (ref 80.0–100.0)
MPV: 10.1 fL (ref 7.5–12.5)
Monocytes Relative: 9.1 %
Neutro Abs: 2507 cells/uL (ref 1500–7800)
Neutrophils Relative %: 55.7 %
Platelets: 244 10*3/uL (ref 140–400)
RBC: 5.05 10*6/uL (ref 3.80–5.10)
RDW: 13.6 % (ref 11.0–15.0)
Total Lymphocyte: 32.1 %
WBC: 4.5 10*3/uL (ref 3.8–10.8)

## 2022-08-14 LAB — COMPLETE METABOLIC PANEL WITH GFR
AG Ratio: 1.9 (calc) (ref 1.0–2.5)
ALT: 17 U/L (ref 6–29)
AST: 15 U/L (ref 10–35)
Albumin: 4.4 g/dL (ref 3.6–5.1)
Alkaline phosphatase (APISO): 89 U/L (ref 31–125)
BUN: 14 mg/dL (ref 7–25)
CO2: 26 mmol/L (ref 20–32)
Calcium: 9.2 mg/dL (ref 8.6–10.2)
Chloride: 103 mmol/L (ref 98–110)
Creat: 0.86 mg/dL (ref 0.50–0.99)
Globulin: 2.3 g/dL (calc) (ref 1.9–3.7)
Glucose, Bld: 143 mg/dL — ABNORMAL HIGH (ref 65–99)
Potassium: 4 mmol/L (ref 3.5–5.3)
Sodium: 140 mmol/L (ref 135–146)
Total Bilirubin: 1.1 mg/dL (ref 0.2–1.2)
Total Protein: 6.7 g/dL (ref 6.1–8.1)
eGFR: 83 mL/min/{1.73_m2} (ref >=60)

## 2022-08-14 LAB — TSH: TSH: 2.67 mIU/L

## 2022-08-14 LAB — HEMOGLOBIN A1C W/OUT EAG: Hgb A1c MFr Bld: 6.4 % of total Hgb — ABNORMAL HIGH (ref <5.7)

## 2022-08-16 ENCOUNTER — Encounter: Payer: Self-pay | Admitting: Physician Assistant

## 2022-08-16 DIAGNOSIS — R7303 Prediabetes: Secondary | ICD-10-CM | POA: Insufficient documentation

## 2022-08-16 NOTE — Progress Notes (Signed)
A1C is 6.4 anything above 6.5 is considered diabetes. You are in pre-diabetes range. Make previously discussed diet changes and start exercising at least 150 minutes a week. Recheck A1C in 3 months.

## 2022-08-21 LAB — COLOGUARD: COLOGUARD: NEGATIVE

## 2022-08-23 NOTE — Progress Notes (Signed)
Negative cologuard. Repeat in 3 years

## 2022-09-15 IMAGING — MG MM DIGITAL SCREENING BILAT W/ TOMO AND CAD
8 series · 8 of 24 positions shown · non-contrast
Comparison: Previous exam(s).

CLINICAL DATA: Screening.

EXAM:
DIGITAL SCREENING BILATERAL MAMMOGRAM WITH TOMOSYNTHESIS AND CAD
TECHNIQUE: Bilateral screening digital craniocaudal and mediolateral oblique
mammograms were obtained. Bilateral screening digital breast
tomosynthesis was performed. The images were evaluated with
computer-aided detection.

[R CC synth-2D]
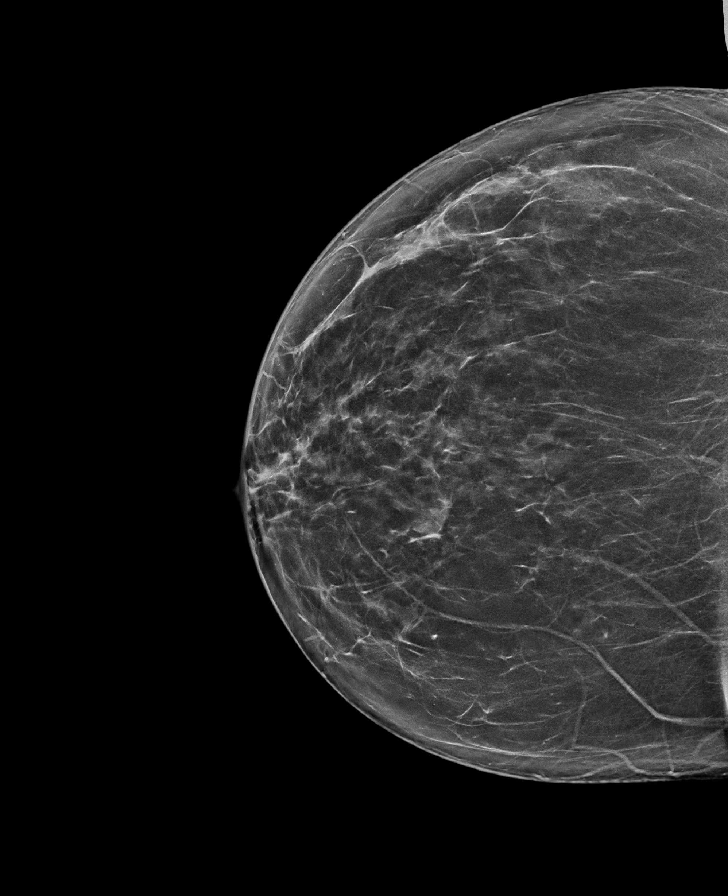

[R MLO synth-2D]
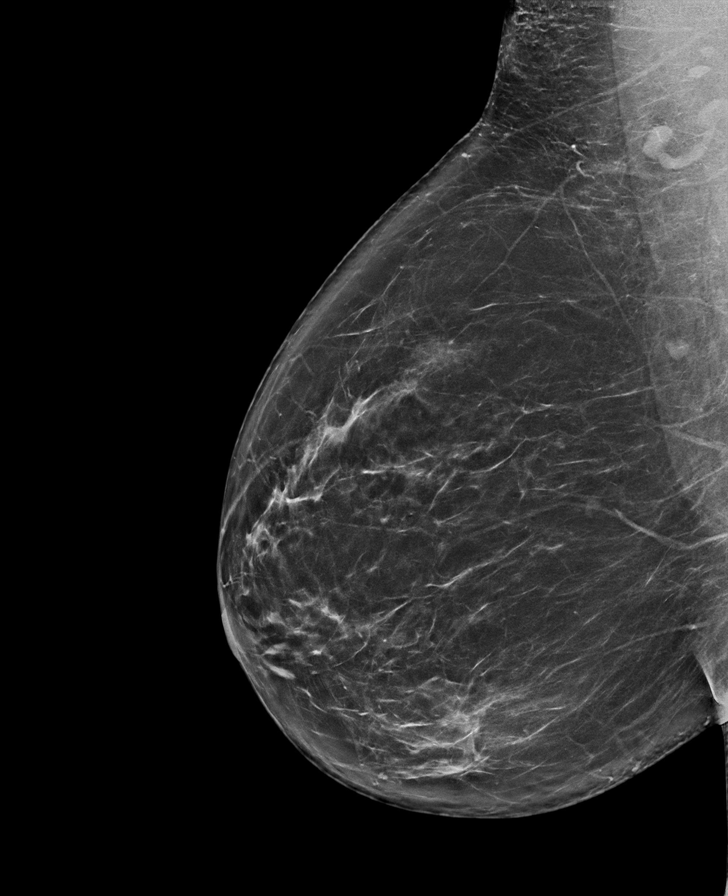

[L CC synth-2D]
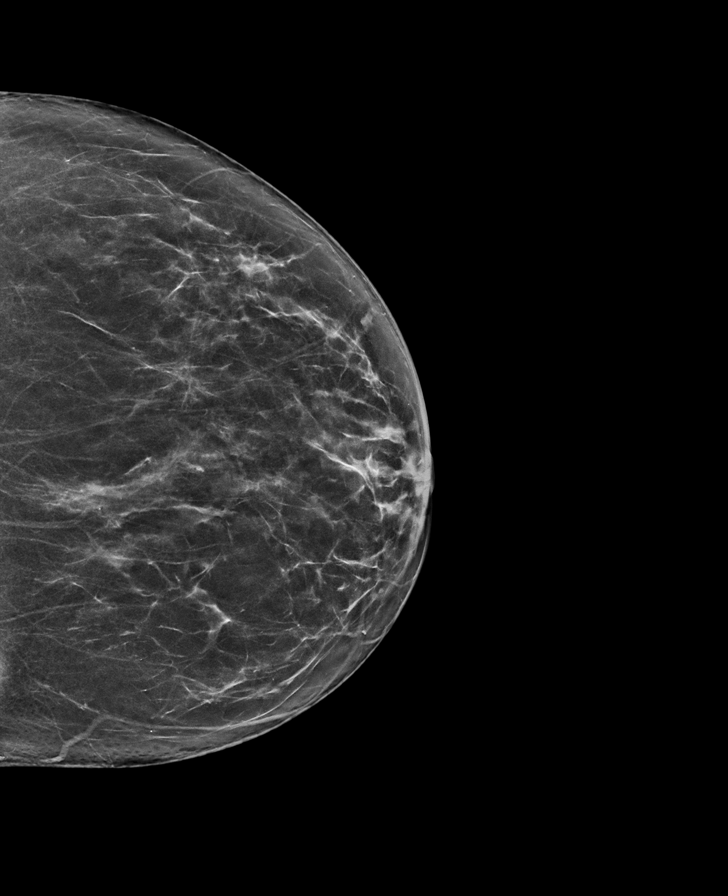

[L MLO synth-2D]
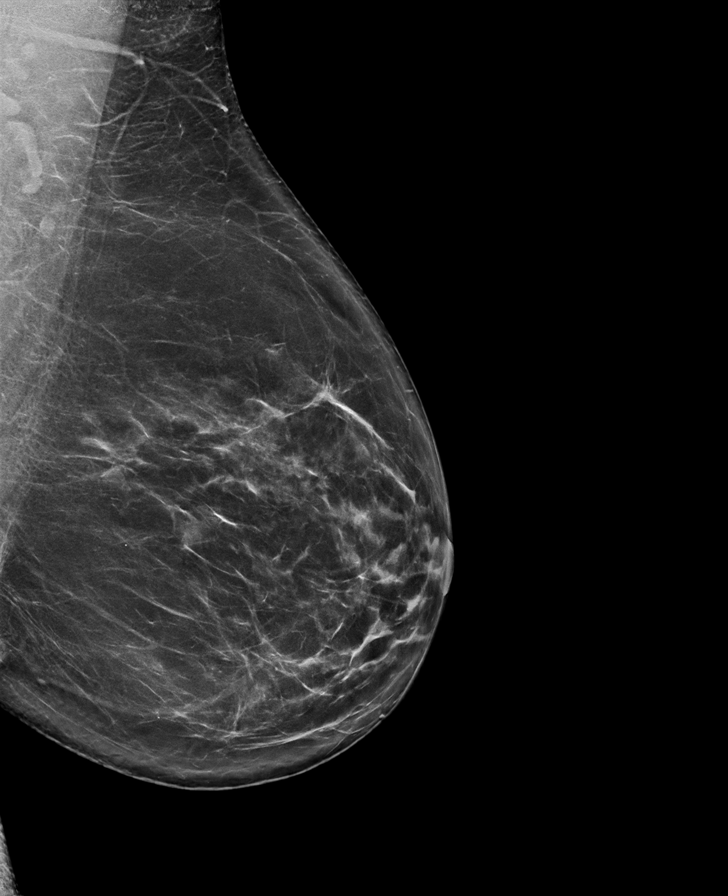

[L MLO tomo · tomo slice 43/84.0]
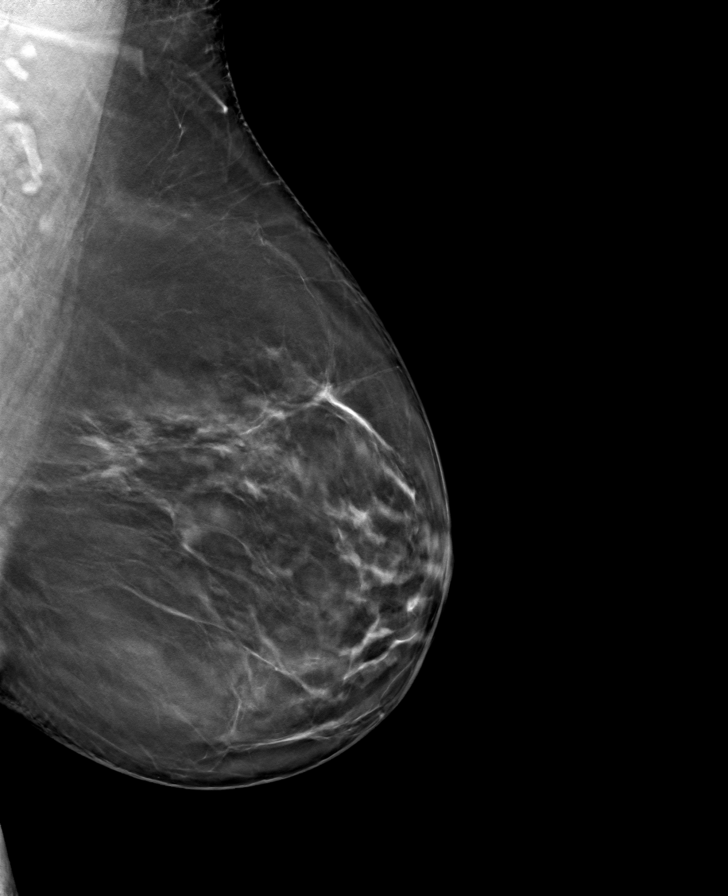

[R CC tomo · tomo slice 37/72.0]
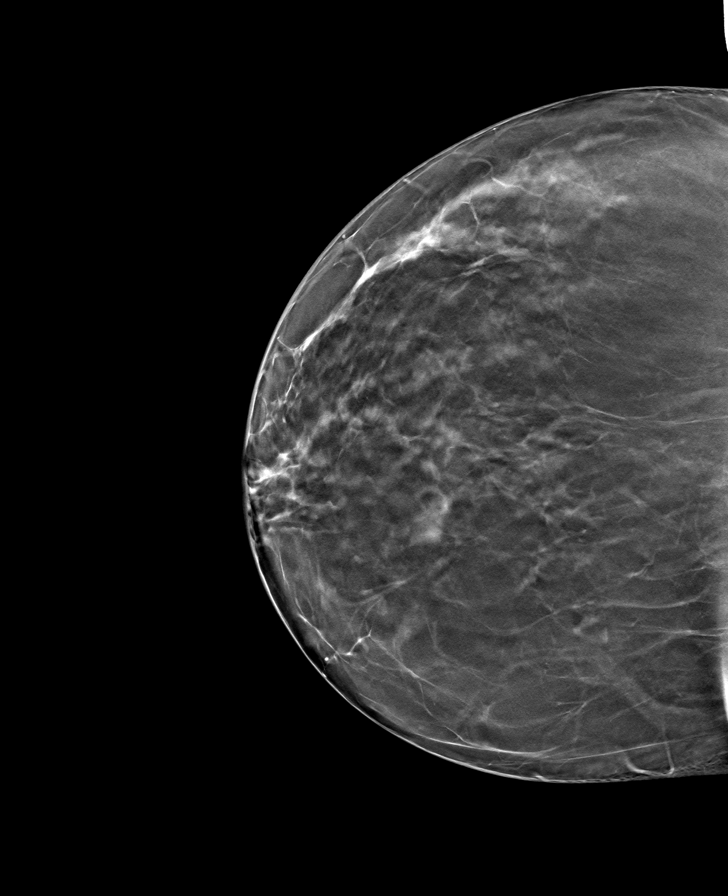

[L CC tomo · tomo slice 37/72.0]
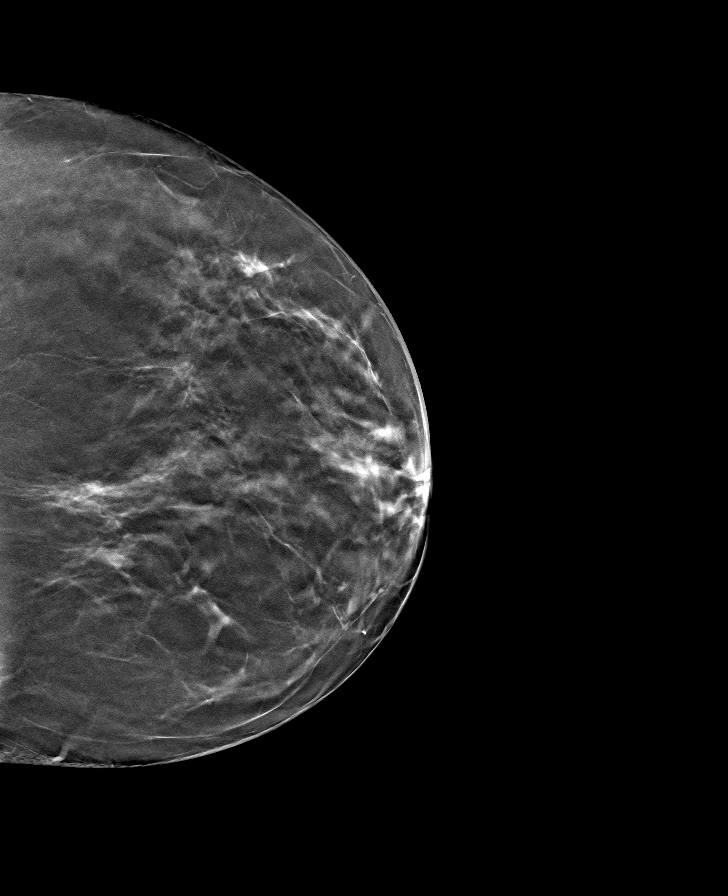

[R MLO tomo · tomo slice 43/86.0]
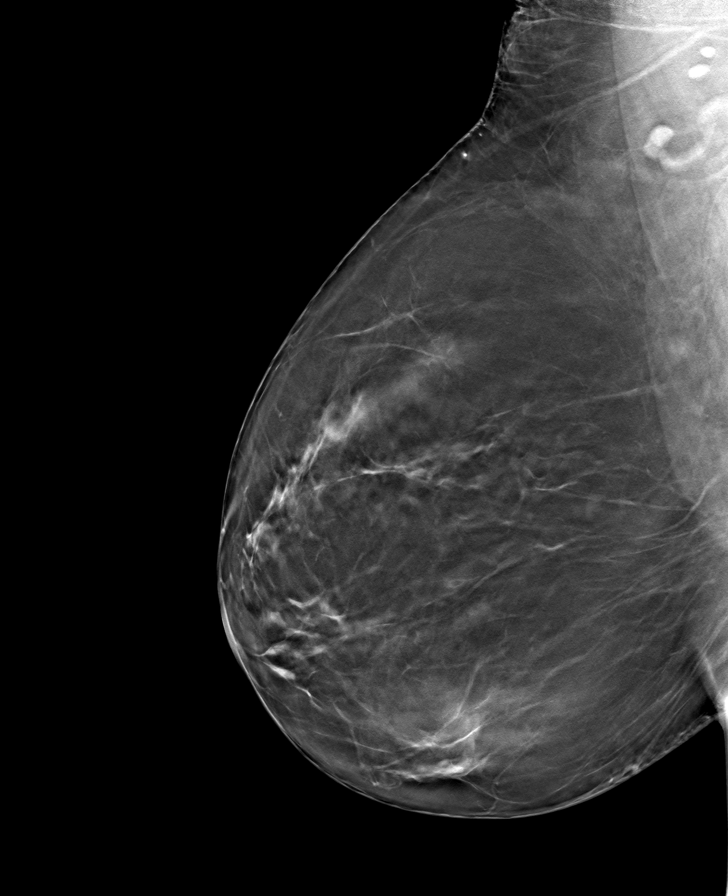

[8 of 24 positions shown; findings below may reference images not displayed]

ACR Breast Density Category b: There are scattered areas of
fibroglandular density.
FINDINGS: There are no findings suspicious for malignancy.
IMPRESSION: No mammographic evidence of malignancy. A result letter of this
screening mammogram will be mailed directly to the patient.

RECOMMENDATION:
Screening mammogram in one year. (Code:51-O-LD2)

BI-RADS CATEGORY  1: Negative.

## 2023-04-14 ENCOUNTER — Telehealth: Payer: BC Managed Care – PPO | Admitting: Nurse Practitioner

## 2023-04-14 DIAGNOSIS — J0140 Acute pansinusitis, unspecified: Secondary | ICD-10-CM

## 2023-04-14 MED ORDER — AMOXICILLIN-POT CLAVULANATE 875-125 MG PO TABS: 1.0000 | ORAL_TABLET | Freq: Two times a day (BID) | ORAL | 0 refills | Status: AC

## 2023-04-14 NOTE — Progress Notes (Signed)

## 2023-05-02 ENCOUNTER — Ambulatory Visit (INDEPENDENT_AMBULATORY_CARE_PROVIDER_SITE_OTHER): Payer: BC Managed Care – PPO | Admitting: Physician Assistant

## 2023-05-02 ENCOUNTER — Encounter: Payer: Self-pay | Admitting: Physician Assistant

## 2023-05-02 DIAGNOSIS — J029 Acute pharyngitis, unspecified: Secondary | ICD-10-CM | POA: Diagnosis not present

## 2023-05-02 MED ORDER — METHYLPREDNISOLONE 4 MG PO TBPK: ORAL_TABLET | ORAL | 0 refills | Status: AC

## 2023-05-02 MED ORDER — IPRATROPIUM BROMIDE 0.06 % NA SOLN: 2.0000 | NASAL | 1 refills | Status: AC | PRN

## 2023-05-02 MED ORDER — AZITHROMYCIN 250 MG PO TABS: ORAL_TABLET | ORAL | 0 refills | Status: AC

## 2023-05-02 NOTE — Progress Notes (Unsigned)
   Acute Office Visit  Subjective:     Patient ID: Mariah Moore, female    DOB: 1971/12/20, 52 y.o.   MRN: [id number]  No chief complaint on file.   HPI Patient is in today for ***  Sore throat for one month early April sinus infection augment a lot of drainage.  No fever   A lot of ear and jaw pain Using some of nasal spray   Sinus congestion worse  ROS      Objective:    There were no vitals taken for this visit. {Vitals History (Optional):23777}  Physical Exam        Assessment & Plan:    No follow-ups on file.  Tandy Gaw, PA-C

## 2023-05-02 NOTE — Patient Instructions (Signed)
Sore Throat When you have a sore throat, your throat may feel: Tender. Burning. Irritated. Scratchy. Painful when you swallow. Painful when you talk. Many things can cause a sore throat, such as: An infection. Allergies. Dry air. Smoke or pollution. Radiation treatment for cancer. Gastroesophageal reflux disease (GERD). A tumor. A sore throat can be the first sign of another sickness. It can happen with other problems, like: Coughing. Sneezing. Fever. Swelling of the glands in the neck. Most sore throats go away without treatment. Follow these instructions at home:     Medicines Take over-the-counter and prescription medicines only as told by your doctor. Children often get sore throats. Do not give your child aspirin. Use throat sprays to soothe your throat as told by your health care provider. Managing pain To help with pain: Sip warm liquids, such as broth, herbal tea, or warm water. Eat or drink cold or frozen liquids, such as frozen ice pops. Rinse your mouth (gargle) with a salt water mixture 3-4 times a day or as needed. To make salt water, dissolve -1 tsp (3-6 g) of salt in 1 cup (237 mL) of warm water. Do not swallow this mixture. Suck on hard candy or throat lozenges. Put a cool-mist humidifier in your bedroom at night. Sit in the bathroom with the door closed for 5-10 minutes while you run hot water in the shower. General instructions Do not smoke or use any products that contain nicotine or tobacco. If you need help quitting, ask your doctor. Get plenty of rest. Drink enough fluid to keep your pee (urine) pale yellow. Wash your hands often for at least 20 seconds with soap and water. If soap and water are not available, use hand sanitizer. Contact a doctor if: You have a fever for more than 2-3 days. You keep having symptoms for more than 2-3 days. Your throat does not get better in 7 days. You have a fever and your symptoms suddenly get worse. Your  child who is 3 months to 3 years old has a temperature of 102.2F (39C) or higher. Get help right away if: You have trouble breathing. You cannot swallow fluids, soft foods, or your spit. You have swelling in your throat or neck that gets worse. You feel like you may vomit (nauseous) and this feeling lasts a long time. You cannot stop vomiting. These symptoms may be an emergency. Get help right away. Call your local emergency services (911 in the [place] Do not wait to see if the symptoms will go away. Do not drive yourself to the hospital. Summary A sore throat is a painful, burning, irritated, or scratchy throat. Many things can cause a sore throat. Take over-the-counter medicines only as told by your doctor. Get plenty of rest. Drink enough fluid to keep your pee (urine) pale yellow. Contact a doctor if your symptoms get worse or your sore throat does not get better within 7 days. This information is not intended to replace advice given to you by your health care provider. Make sure you discuss any questions you have with your health care provider. Document Revised: 03/11/2021 Document Reviewed: 03/11/2021 Else[employer] Patient Education  2023 [employer]  

## 2023-05-03 ENCOUNTER — Encounter: Payer: Self-pay | Admitting: Physician Assistant

## 2023-07-22 ENCOUNTER — Other Ambulatory Visit: Payer: Self-pay | Admitting: Physician Assistant

## 2023-07-22 DIAGNOSIS — Z1231 Encounter for screening mammogram for malignant neoplasm of breast: Secondary | ICD-10-CM

## 2023-08-24 ENCOUNTER — Ambulatory Visit: Payer: BC Managed Care – PPO

## 2023-09-08 ENCOUNTER — Ambulatory Visit (INDEPENDENT_AMBULATORY_CARE_PROVIDER_SITE_OTHER): Payer: BC Managed Care – PPO

## 2023-09-08 DIAGNOSIS — Z1231 Encounter for screening mammogram for malignant neoplasm of breast: Secondary | ICD-10-CM

## 2023-09-09 NOTE — Progress Notes (Signed)
Normal mammogram. Follow up in 1 year.

## 2023-12-27 ENCOUNTER — Telehealth: Payer: BC Managed Care – PPO | Admitting: Physician Assistant

## 2023-12-27 DIAGNOSIS — J019 Acute sinusitis, unspecified: Secondary | ICD-10-CM | POA: Diagnosis not present

## 2023-12-27 DIAGNOSIS — B9689 Other specified bacterial agents as the cause of diseases classified elsewhere: Secondary | ICD-10-CM | POA: Diagnosis not present

## 2023-12-27 MED ORDER — AMOXICILLIN-POT CLAVULANATE 875-125 MG PO TABS: 1.0000 | ORAL_TABLET | Freq: Two times a day (BID) | ORAL | 0 refills | Status: AC

## 2023-12-27 NOTE — Progress Notes (Signed)

## 2024-12-31 ENCOUNTER — Other Ambulatory Visit: Payer: Self-pay | Admitting: Physician Assistant

## 2024-12-31 DIAGNOSIS — Z1231 Encounter for screening mammogram for malignant neoplasm of breast: Secondary | ICD-10-CM

## 2025-01-10 ENCOUNTER — Ambulatory Visit

## 2025-01-10 DIAGNOSIS — Z1231 Encounter for screening mammogram for malignant neoplasm of breast: Secondary | ICD-10-CM

## 2025-01-14 ENCOUNTER — Ambulatory Visit: Payer: Self-pay | Admitting: Physician Assistant

## 2025-01-14 NOTE — Progress Notes (Signed)
 Normal mammogram. Follow up in 1 year.
